# Patient Record
Sex: Male | Born: 1965
Health system: Southern US, Community
[De-identification: ages and names within clinical notes are randomized; demographics above are authoritative.]

## PROBLEM LIST (undated history)

## (undated) DIAGNOSIS — I10 Essential (primary) hypertension: Secondary | ICD-10-CM

## (undated) DIAGNOSIS — E119 Type 2 diabetes mellitus without complications: Secondary | ICD-10-CM

## (undated) HISTORY — DX: Essential (primary) hypertension: I10

## (undated) HISTORY — PX: COLONOSCOPY: SHX174

## (undated) HISTORY — PX: POLYPECTOMY: SHX149

## (undated) HISTORY — DX: Type 2 diabetes mellitus without complications: E11.9

## (undated) HISTORY — PX: SHOULDER SURGERY: SHX246

---

## 2007-02-25 ENCOUNTER — Emergency Department (HOSPITAL_COMMUNITY): Admission: EM | Admit: 2007-02-25 | Discharge: 2007-02-25 | Payer: Self-pay | Admitting: Emergency Medicine

## 2011-07-07 LAB — URINE MICROSCOPIC-ADD ON

## 2011-07-07 LAB — URINALYSIS, ROUTINE W REFLEX MICROSCOPIC
Bilirubin Urine: NEGATIVE
Glucose, UA: NEGATIVE
Hgb urine dipstick: NEGATIVE
Protein, ur: NEGATIVE
Urobilinogen, UA: 0.2

## 2011-07-07 LAB — URINE CULTURE: Colony Count: NO GROWTH

## 2016-06-10 ENCOUNTER — Ambulatory Visit (HOSPITAL_COMMUNITY)
Admission: EM | Admit: 2016-06-10 | Discharge: 2016-06-10 | Disposition: A | Discharge status: Home / Self Care | Payer: 59 | Attending: Family Medicine | Admitting: Family Medicine

## 2016-06-10 ENCOUNTER — Encounter (HOSPITAL_COMMUNITY): Payer: Self-pay | Admitting: *Deleted

## 2016-06-10 DIAGNOSIS — M25561 Pain in right knee: Secondary | ICD-10-CM | POA: Diagnosis not present

## 2016-06-10 DIAGNOSIS — B070 Plantar wart: Secondary | ICD-10-CM | POA: Diagnosis not present

## 2016-06-10 MED ORDER — PREDNISONE 20 MG PO TABS: ORAL_TABLET | ORAL | 0 refills | Status: DC

## 2016-06-10 NOTE — ED Provider Notes (Addendum)
[place]    CSN: [id number] Arrival date & time: 06/10/16  [id number]  First Provider Contact:  First MD Initiated Contact with Patient 06/10/16 1039        History   Chief Complaint Chief Complaint  Patient presents with  . Knee Pain    HPI Jose Kelley is a 50 y.o. male.   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  He developed a wart on his left great toe a month ago, and subsequently right knee swelling and discomfort when trying to fully flex his right knee.  The toe has become quite sensitive to the touch  He has no hx of trauma to the knee and no other joints hurt.      History reviewed. No pertinent past medical history.  There are no active problems to display for this patient.   History reviewed. No pertinent surgical history.     Home Medications    Prior to Admission medications   Medication Sig Start Date End Date Taking? Authorizing Provider  predniSONE (DELTASONE) 20 MG tablet Two daily with food 06/10/16   Robyn Haber, MD    Family History History reviewed. No pertinent family history.  Social History Social History  Substance Use Topics  . Smoking status: Current Every Day Smoker  . Smokeless tobacco: Current User  . Alcohol use No     Allergies   Review of patient's allergies indicates not on file.   Review of Systems Review of Systems  Constitutional: Negative.   HENT: Negative.   Eyes: Negative.   Respiratory: Negative.   Cardiovascular: Negative.   Gastrointestinal: Negative.   Musculoskeletal: Positive for joint swelling.     Physical Exam Triage Vital Signs ED Triage Vitals [06/10/16 1014]  Enc Vitals Group     BP 166/100     Pulse Rate 74     Resp 16     Temp 98.1 F (36.7 C)     Temp Source Oral     SpO2 98 %     Weight      Height      Head Circumference      Peak Flow      Pain Score      Pain Loc      Pain Edu?      Excl. in [place]?     No data found.   Updated Vital Signs BP 166/100 (BP Location: Left Arm)   Pulse 74   Temp 98.1 F (36.7 C) (Oral)   Resp 16   SpO2 98%       Physical Exam  Constitutional: He appears well-developed and well-nourished.  HENT:  Right Ear: External ear normal.  Left Ear: External ear normal.  Nose: Nose normal.  Mouth/Throat: Oropharynx is clear and moist.  Eyes: Conjunctivae and EOM are normal. Pupils are equal, round, and reactive to light.  Neck: Normal range of motion. Neck supple.  Cardiovascular: Normal rate.   Pulmonary/Chest: Effort normal.  Musculoskeletal: Normal range of motion. He exhibits no edema, tenderness or deformity.  Mild popliteal fullness behind the right knee, unable to completely flex the right knee.  Neurological: He is alert.  Skin:  Small wart on the mid plantar surface of the left great toe which is quite tender. This was debrided with a #15 Bard-Parker blade relieving most of the patient's pain when standing.  Nursing note and vitals reviewed.    UC Treatments / Results  Labs (all labs ordered are listed, but only abnormal results are displayed) Labs Reviewed - No data to display  EKG  EKG Interpretation None       Radiology No results found.  Procedures Procedures (including critical care time)  Medications Ordered in UC Medications - No data to display   Initial Impression / Assessment and Plan / UC Course  I have reviewed the triage vital signs and the nursing notes.  Pertinent labs & imaging results that were available during my care of the patient were reviewed by me and considered in my medical decision making (see chart for details).  Clinical Course      Final Clinical Impressions(s) / UC Diagnoses   Final diagnoses:  Knee pain, acute, right  Plantar wart of left foot    New Prescriptions New Prescriptions   PREDNISONE (DELTASONE) 20 MG TABLET    Two daily with food     Robyn Haber, MD 06/10/16  1058    Robyn Haber, MD 06/10/16 1104

## 2016-06-10 NOTE — Discharge Instructions (Signed)
Two different strategies for curing warts:   1. Take cimetidine 300 mg twice a day for a month 2. Apply adhesive tape over the wart for a month  In the meantime, whittle the thickened skin when the toe gets painful

## 2016-06-10 NOTE — ED Triage Notes (Signed)
Pt  Reports    Pain  r  Knee       X  1   Week     -     denys  Any  specefic  Injury  Pt  Also  Reports    Soreness  l  Big  Toe         As  Well

## 2016-11-19 ENCOUNTER — Ambulatory Visit (INDEPENDENT_AMBULATORY_CARE_PROVIDER_SITE_OTHER): Payer: BLUE CROSS/BLUE SHIELD | Admitting: Family Medicine

## 2016-11-19 ENCOUNTER — Telehealth: Payer: Self-pay

## 2016-11-19 ENCOUNTER — Telehealth: Payer: Self-pay | Admitting: Family Medicine

## 2016-11-19 VITALS — BP 172/90 | HR 85 | Temp 98.2°F | Resp 18 | Ht 67.5 in | Wt 173.4 lb

## 2016-11-19 DIAGNOSIS — J209 Acute bronchitis, unspecified: Secondary | ICD-10-CM | POA: Diagnosis not present

## 2016-11-19 DIAGNOSIS — Z720 Tobacco use: Secondary | ICD-10-CM | POA: Diagnosis not present

## 2016-11-19 DIAGNOSIS — I10 Essential (primary) hypertension: Secondary | ICD-10-CM

## 2016-11-19 MED ORDER — BENZONATATE 200 MG PO CAPS: 200.0000 mg | ORAL_CAPSULE | Freq: Three times a day (TID) | ORAL | 0 refills | Status: DC | PRN

## 2016-11-19 MED ORDER — CODEINE POLT-CHLORPHEN POLT ER 14.7-2.8 MG/5ML PO SUER: 10.0000 mL | Freq: Every day | ORAL | 0 refills | Status: DC

## 2016-11-19 MED ORDER — HYDROCHLOROTHIAZIDE 25 MG PO TABS: 25.0000 mg | ORAL_TABLET | Freq: Every day | ORAL | 1 refills | Status: DC

## 2016-11-19 NOTE — Telephone Encounter (Signed)
Pt called stating he needs a Prior Auth. For Codeine Polt-Chlorphen Polt ER (TUZISTRA XR) 14.7-2.8 MG/5ML SUER [id number]   Please advise: [phone]

## 2016-11-19 NOTE — Telephone Encounter (Signed)
tuzistra xr needs PA [id number] covermymeds

## 2016-11-19 NOTE — Patient Instructions (Addendum)
IF you received an x-ray today, you will receive an invoice from [facility]. Please contact [facility] at [phone] with questions or concerns regarding your invoice.   IF you received labwork today, you will receive an invoice from [place]. Please contact [facility] at [phone] with questions or concerns regarding your invoice.   Our billing staff will not be able to assist you with questions regarding bills from these companies.  You will be contacted with the lab results as soon as they are available. The fastest way to get your results is to activate your My Chart account. Instructions are located on the last page of this paperwork. If you have not heard from [place] regarding the results in 2 weeks, please contact this office.      Hypertension Hypertension, commonly called high blood pressure, is when the force of blood pumping through the arteries is too strong. The arteries are the blood vessels that carry blood from the heart throughout the body. Hypertension forces the heart to work harder to pump blood and may cause arteries to become narrow or stiff. Having untreated or uncontrolled hypertension can cause heart attacks, strokes, kidney disease, and other problems. A blood pressure reading consists of a higher number over a lower number. Ideally, your blood pressure should be below 120/80. The first ("top") number is called the systolic pressure. It is a measure of the pressure in your arteries as your heart beats. The second ("bottom") number is called the diastolic pressure. It is a measure of the pressure in your arteries as the heart relaxes. What are the causes? The cause of this condition is not known. What increases the risk? Some risk factors for high blood pressure are under your control. Others are not. Factors you can change   Smoking.  Having type 2 diabetes mellitus, high cholesterol, or both.  Not getting enough exercise or physical  activity.  Being overweight.  Having too much fat, sugar, calories, or salt (sodium) in your diet.  Drinking too much alcohol. Factors that are difficult or impossible to change   Having chronic kidney disease.  Having a family history of high blood pressure.  Age. Risk increases with age.  Race. You may be at higher risk if you are African-American.  Gender. Men are at higher risk than women before age 18. After age 96, women are at higher risk than men.  Having obstructive sleep apnea.  Stress. What are the signs or symptoms? Extremely high blood pressure (hypertensive crisis) may cause:  Headache.  Anxiety.  Shortness of breath.  Nosebleed.  Nausea and vomiting.  Severe chest pain.  Jerky movements you cannot control (seizures). How is this diagnosed? This condition is diagnosed by measuring your blood pressure while you are seated, with your arm resting on a surface. The cuff of the blood pressure monitor will be placed directly against the skin of your upper arm at the level of your heart. It should be measured at least twice using the same arm. Certain conditions can cause a difference in blood pressure between your right and left arms. Certain factors can cause blood pressure readings to be lower or higher than normal (elevated) for a short period of time:  When your blood pressure is higher when you are in a health care provider's office than when you are at home, this is called white coat hypertension. Most people with this condition do not need medicines.  When your blood pressure is higher at home than  when you are in a health care provider's office, this is called masked hypertension. Most people with this condition may need medicines to control blood pressure. If you have a high blood pressure reading during one visit or you have normal blood pressure with other risk factors:  You may be asked to return on a different day to have your blood pressure checked  again.  You may be asked to monitor your blood pressure at home for 1 week or longer. If you are diagnosed with hypertension, you may have other blood or imaging tests to help your health care provider understand your overall risk for other conditions. How is this treated? This condition is treated by making healthy lifestyle changes, such as eating healthy foods, exercising more, and reducing your alcohol intake. Your health care provider may prescribe medicine if lifestyle changes are not enough to get your blood pressure under control, and if:  Your systolic blood pressure is above 130.  Your diastolic blood pressure is above 80. Your personal target blood pressure may vary depending on your medical conditions, your age, and other factors. Follow these instructions at home: Eating and drinking   Eat a diet that is high in fiber and potassium, and low in sodium, added sugar, and fat. An example eating plan is called the DASH (Dietary Approaches to Stop Hypertension) diet. To eat this way:  Eat plenty of fresh fruits and vegetables. Try to fill half of your plate at each meal with fruits and vegetables.  Eat whole grains, such as whole wheat pasta, brown rice, or whole grain bread. Fill about one quarter of your plate with whole grains.  Eat or drink low-fat dairy products, such as skim milk or low-fat yogurt.  Avoid fatty cuts of meat, processed or cured meats, and poultry with skin. Fill about one quarter of your plate with lean proteins, such as fish, chicken without skin, beans, eggs, and tofu.  Avoid premade and processed foods. These tend to be higher in sodium, added sugar, and fat.  Reduce your daily sodium intake. Most people with hypertension should eat less than 1,500 mg of sodium a day.  Limit alcohol intake to no more than 1 drink a day for nonpregnant women and 2 drinks a day for men. One drink equals 12 oz of beer, 5 oz of wine, or 1 oz of hard liquor. Lifestyle   Work  with your health care provider to maintain a healthy body weight or to lose weight. Ask what an ideal weight is for you.  Get at least 30 minutes of exercise that causes your heart to beat faster (aerobic exercise) most days of the week. Activities may include walking, swimming, or biking.  Include exercise to strengthen your muscles (resistance exercise), such as pilates or lifting weights, as part of your weekly exercise routine. Try to do these types of exercises for 30 minutes at least 3 days a week.  Do not use any products that contain nicotine or tobacco, such as cigarettes and e-cigarettes. If you need help quitting, ask your health care provider.  Monitor your blood pressure at home as told by your health care provider.  Keep all follow-up visits as told by your health care provider. This is important. Medicines   Take over-the-counter and prescription medicines only as told by your health care provider. Follow directions carefully. Blood pressure medicines must be taken as prescribed.  Do not skip doses of blood pressure medicine. Doing this puts you at risk for  problems and can make the medicine less effective.  Ask your health care provider about side effects or reactions to medicines that you should watch for. Contact a health care provider if:  You think you are having a reaction to a medicine you are taking.  You have headaches that keep coming back (recurring).  You feel dizzy.  You have swelling in your ankles.  You have trouble with your vision. Get help right away if:  You develop a severe headache or confusion.  You have unusual weakness or numbness.  You feel faint.  You have severe pain in your chest or abdomen.  You vomit repeatedly.  You have trouble breathing. Summary  Hypertension is when the force of blood pumping through your arteries is too strong. If this condition is not controlled, it may put you at risk for serious complications.  Your  personal target blood pressure may vary depending on your medical conditions, your age, and other factors. For most people, a normal blood pressure is less than 120/80.  Hypertension is treated with lifestyle changes, medicines, or a combination of both. Lifestyle changes include weight loss, eating a healthy, low-sodium diet, exercising more, and limiting alcohol. This information is not intended to replace advice given to you by your health care provider. Make sure you discuss any questions you have with your health care provider. Document Released: 09/05/2005 Document Revised: 08/03/2016 Document Reviewed: 08/03/2016 Elsevier Interactive Patient Education  2017 [place] DASH stands for "Dietary Approaches to Stop Hypertension." The DASH eating plan is a healthy eating plan that has been shown to reduce high blood pressure (hypertension). It may also reduce your risk for type 2 diabetes, heart disease, and stroke. The DASH eating plan may also help with weight loss. What are tips for following this plan? General guidelines   Avoid eating more than 2,300 mg (milligrams) of salt (sodium) a day. If you have hypertension, you may need to reduce your sodium intake to 1,500 mg a day.  Limit alcohol intake to no more than 1 drink a day for nonpregnant women and 2 drinks a day for men. One drink equals 12 oz of beer, 5 oz of wine, or 1 oz of hard liquor.  Work with your health care provider to maintain a healthy body weight or to lose weight. Ask what an ideal weight is for you.  Get at least 30 minutes of exercise that causes your heart to beat faster (aerobic exercise) most days of the week. Activities may include walking, swimming, or biking.  Work with your health care provider or diet and nutrition specialist (dietitian) to adjust your eating plan to your individual calorie needs. Reading food labels   Check food labels for the amount of sodium per serving. Choose foods  with less than 5 percent of the Daily Value of sodium. Generally, foods with less than 300 mg of sodium per serving fit into this eating plan.  To find whole grains, look for the word "whole" as the first word in the ingredient list. Shopping   Buy products labeled as "low-sodium" or "no salt added."  Buy fresh foods. Avoid canned foods and premade or frozen meals. Cooking   Avoid adding salt when cooking. Use salt-free seasonings or herbs instead of table salt or sea salt. Check with your health care provider or pharmacist before using salt substitutes.  Do not fry foods. Cook foods using healthy methods such as baking, boiling, grilling, and broiling instead.  Cook with heart-healthy oils, such as olive, canola, soybean, or sunflower oil. Meal planning    Eat a balanced diet that includes:  5 or more servings of fruits and vegetables each day. At each meal, try to fill half of your plate with fruits and vegetables.  Up to 6-8 servings of whole grains each day.  Less than 6 oz of lean meat, poultry, or fish each day. A 3-oz serving of meat is about the same size as a deck of cards. One egg equals 1 oz.  2 servings of low-fat dairy each day.  A serving of nuts, seeds, or beans 5 times each week.  Heart-healthy fats. Healthy fats called Omega-3 fatty acids are found in foods such as flaxseeds and coldwater fish, like sardines, salmon, and mackerel.  Limit how much you eat of the following:  Canned or prepackaged foods.  Food that is high in trans fat, such as fried foods.  Food that is high in saturated fat, such as fatty meat.  Sweets, desserts, sugary drinks, and other foods with added sugar.  Full-fat dairy products.  Do not salt foods before eating.  Try to eat at least 2 vegetarian meals each week.  Eat more home-cooked food and less restaurant, buffet, and fast food.  When eating at a restaurant, ask that your food be prepared with less salt or no salt, if  possible. What foods are recommended? The items listed may not be a complete list. Talk with your dietitian about what dietary choices are best for you. Grains  Whole-grain or whole-wheat bread. Whole-grain or whole-wheat pasta. Brown rice. [place]. Bulgur. Whole-grain and low-sodium cereals. Pita bread. Low-fat, low-sodium crackers. Whole-wheat flour tortillas. Vegetables  Fresh or frozen vegetables (raw, steamed, roasted, or grilled). Low-sodium or reduced-sodium tomato and vegetable juice. Low-sodium or reduced-sodium tomato sauce and tomato paste. Low-sodium or reduced-sodium canned vegetables. Fruits  All fresh, dried, or frozen fruit. Canned fruit in natural juice (without added sugar). Meat and other protein foods  Skinless chicken or [place]. Ground chicken or [place]. Pork with fat trimmed off. Fish and seafood. Egg whites. Dried beans, peas, or lentils. Unsalted nuts, nut butters, and seeds. Unsalted canned beans. Lean cuts of beef with fat trimmed off. Low-sodium, lean deli meat. Dairy  Low-fat (1%) or fat-free (skim) milk. Fat-free, low-fat, or reduced-fat cheeses. Nonfat, low-sodium ricotta or cottage cheese. Low-fat or nonfat yogurt. Low-fat, low-sodium cheese. Fats and oils  Soft margarine without trans fats. Vegetable oil. Low-fat, reduced-fat, or light mayonnaise and salad dressings (reduced-sodium). Canola, safflower, olive, soybean, and sunflower oils. Avocado. Seasoning and other foods  Herbs. Spices. Seasoning mixes without salt. Unsalted popcorn and pretzels. Fat-free sweets. What foods are not recommended? The items listed may not be a complete list. Talk with your dietitian about what dietary choices are best for you. Grains  Baked goods made with fat, such as croissants, muffins, or some breads. Dry pasta or rice meal packs. Vegetables  Creamed or fried vegetables. Vegetables in a cheese sauce. Regular canned vegetables (not low-sodium or reduced-sodium). Regular  canned tomato sauce and paste (not low-sodium or reduced-sodium). Regular tomato and vegetable juice (not low-sodium or reduced-sodium). Angie Fava. Olives. Fruits  Canned fruit in a light or heavy syrup. Fried fruit. Fruit in cream or butter sauce. Meat and other protein foods  Fatty cuts of meat. Ribs. Fried meat. Berniece Salines. Sausage. Bologna and other processed lunch meats. Salami. Fatback. Hotdogs. Bratwurst. Salted nuts and seeds. Canned beans with added salt. Canned or smoked  fish. Whole eggs or egg yolks. Chicken or [place] with skin. Dairy  Whole or 2% milk, cream, and half-and-half. Whole or full-fat cream cheese. Whole-fat or sweetened yogurt. Full-fat cheese. Nondairy creamers. Whipped toppings. Processed cheese and cheese spreads. Fats and oils  Butter. Stick margarine. Lard. Shortening. Ghee. Bacon fat. Tropical oils, such as coconut, palm kernel, or palm oil. Seasoning and other foods  Salted popcorn and pretzels. Onion salt, garlic salt, seasoned salt, table salt, and sea salt. Worcestershire sauce. Tartar sauce. Barbecue sauce. Teriyaki sauce. Soy sauce, including reduced-sodium. Steak sauce. Canned and packaged gravies. Fish sauce. Oyster sauce. Cocktail sauce. Horseradish that you find on the shelf. Ketchup. Mustard. Meat flavorings and tenderizers. Bouillon cubes. Hot sauce and Tabasco sauce. Premade or packaged marinades. Premade or packaged taco seasonings. Relishes. Regular salad dressings. Where to find more information:  National Heart, Lung, and [place]: https://wilson-eaton.com/  American Heart Association: www.heart.org Summary  The DASH eating plan is a healthy eating plan that has been shown to reduce high blood pressure (hypertension). It may also reduce your risk for type 2 diabetes, heart disease, and stroke.  With the DASH eating plan, you should limit salt (sodium) intake to 2,300 mg a day. If you have hypertension, you may need to reduce your sodium intake to 1,500 mg a  day.  When on the DASH eating plan, aim to eat more fresh fruits and vegetables, whole grains, lean proteins, low-fat dairy, and heart-healthy fats.  Work with your health care provider or diet and nutrition specialist (dietitian) to adjust your eating plan to your individual calorie needs. This information is not intended to replace advice given to you by your health care provider. Make sure you discuss any questions you have with your health care provider. Document Released: 08/25/2011 Document Revised: 08/29/2016 Document Reviewed: 08/29/2016 Elsevier Interactive Patient Education  2017 [employer].

## 2016-11-19 NOTE — Telephone Encounter (Signed)
Approved today

## 2016-11-19 NOTE — Progress Notes (Signed)
By signing my name below, I, Mesha Guinyard, attest that this documentation has been prepared under the direction and in the presence of Delman Cheadle, MD.  Electronically Signed: Verlee Monte, Medical Scribe. 11/19/16. 11:59 AM.  Subjective:    Patient ID: Jose Kelley, male    DOB: 1965-10-12, 51 y.o.   MRN: [id number]  HPI Chief Complaint  Patient presents with  . Cough    started with a cold about 10 days ago, C/O prod cough, chest congestion, & chest pain when coughing    HPI Comments: Jose Kelley is a 51 y.o. male who presents to the [facility] complaining of painful productive cough with yellow to clear sputum (mainly clear) onset 10 days ago. He states he's over his cold, but reports associated sxs of chest congestion, rhinorrhea with clear sputum, little sinus pressure, little sinus pain, and some SOB. Took mucinex, and nyquil for little relief of his sxs. Pt did not drink alcohol last night. Pt smokes 0.5 ppd, or 4 packs a week. He has unsuccessfully tried to quit cold [place] in the past. Denies PMHx of asthma. Denies sleep loss, ear pain, postnasal drip, fever, and sneezing.  Blood Pressure: Pt states it's been a while since he's had his bp checked. Denies HA, changes in vision, and palpitations. BP Readings from Last 3 Encounters:  11/19/16 (!) 170/100  06/10/16 166/100   There are no active problems to display for this patient.  No past medical history on file. No past surgical history on file. No Known Allergies Prior to Admission medications   Not on File   Social History   Social History  . Marital status: Married    Spouse name: N/A  . Number of children: N/A  . Years of education: N/A   Occupational History  . Not on file.   Social History Main Topics  . Smoking status: Current Every Day Smoker    Packs/day: 0.50    Years: 10.00  . Smokeless tobacco: Former Systems developer  . Alcohol use No  . Drug use: No  . Sexual activity: Not on file    Other Topics Concern  . Not on file   Social History Narrative  . No narrative on file   Review of Systems  Constitutional: Negative for fever.  HENT: Positive for congestion, rhinorrhea, sinus pain and sinus pressure. Negative for ear pain, postnasal drip and sneezing.   Eyes: Negative for visual disturbance.  Respiratory: Positive for cough and shortness of breath.   Cardiovascular: Positive for chest pain (with coughing). Negative for palpitations.  Neurological: Negative for headaches.  Psychiatric/Behavioral: Negative for sleep disturbance.   Objective:  Physical Exam  Constitutional: He appears well-developed and well-nourished. No distress.  HENT:  Head: Normocephalic and atraumatic.  Right Ear: Tympanic membrane is erythematous.  Left Ear: Tympanic membrane is erythematous.  Nose: Rhinorrhea present.  Mouth/Throat: Oropharynx is clear and moist. No oropharyngeal exudate.  Nares with erythema and rhinitis  Eyes: Conjunctivae are normal.  Neck: Neck supple. No thyroid mass and no thyromegaly present.  Cardiovascular: Normal rate, regular rhythm, S1 normal, S2 normal and normal heart sounds.  Exam reveals no gallop and no friction rub.   No murmur heard. Pulmonary/Chest: Effort normal and breath sounds normal. He has no decreased breath sounds. He has no wheezes. He has no rhonchi. He has no rales.  Lymphadenopathy:    He has cervical adenopathy (anterior).  Neurological: He is alert.  Skin: Skin is warm and dry.  Psychiatric: He has a normal mood and affect. His behavior is normal.  Nursing note and vitals reviewed.  BP (!) 170/100   Pulse 85   Temp 98.2 F (36.8 C) (Oral)   Resp 18   Ht 5' 7.5" (1.715 m)   Wt 173 lb 6 oz (78.6 kg)   SpO2 97%   BMI 26.75 kg/m    Manuel BP reading while sitting, right arm: 172/90 Assessment & Plan:   1. Acute bronchitis, unspecified organism - suspect that pt is over the acute illness of the bronchitis and is now left with a  post-viral cough worsened by chronic tobacco use. Treat symptomatically with cough suppression, call for zpack if sxs worsening with increased cough with increased or change in sputum. If any ShoB, wheezing, CP -> recheck in office immed.  2. Tobacco abuse - encouraged cessation, has cut down to 1/2 ppd, only tried cold [place] before so discuss other options at next OV when feeling better - pt agrees.  3. Essential hypertension - BP WAY to high, likely elev some with acute illness but to high to just attribute to illness alone, highly suspect underlying undiagnosed HTN so start hctz and RTC for recheck in 3 wks. Remind to stay away from all cough and cold medications containing decongestant, especially phenylephrine and pseudoephedrine (will be listed under the active ingredient list).  To make it easier, CoricidinHBP is a product tailored towards people with hypertension.      Meds ordered this encounter  Medications  . benzonatate (TESSALON) 200 MG capsule    Sig: Take 1 capsule (200 mg total) by mouth 3 (three) times daily as needed for cough.    Dispense:  45 capsule    Refill:  0  . Codeine Polt-Chlorphen Polt ER (TUZISTRA XR) 14.7-2.8 MG/5ML SUER    Sig: Take 10 mLs by mouth at bedtime.    Dispense:  140 mL    Refill:  0  . hydrochlorothiazide (HYDRODIURIL) 25 MG tablet    Sig: Take 1 tablet (25 mg total) by mouth daily.    Dispense:  30 tablet    Refill:  1    I personally performed the services described in this documentation, which was scribed in my presence. The recorded information has been reviewed and considered, and addended by me as needed.   Delman Cheadle, M.D.  Primary Care at [facility] [address] [place], [place] [address] [phone] phone [phone] fax  11/22/16 10:44 AM

## 2016-11-22 MED ORDER — PROMETHAZINE-CODEINE 6.25-10 MG/5ML PO SYRP: 5.0000 mL | ORAL_SOLUTION | ORAL | 0 refills | Status: DC | PRN

## 2016-11-22 NOTE — Telephone Encounter (Signed)
Please call in the promethazine-codeine cough syrup I ordered on the med list instead of the [place] and let pt know.  Thanks.

## 2016-11-22 NOTE — Telephone Encounter (Signed)
Called [facility]

## 2016-11-22 NOTE — Telephone Encounter (Signed)
DrugTuzistra XR 14.7-2.8MG /5ML er suspension  [employer] [place] initiate the prior authorization process, please use this form to document the information needed and call [employer] at [phone]  Original Claim [place] Prescriber. Preferred products are: [id number] Josie Saunders [id number] - [place] [employer]'s Preferred Products  HYDROCODONE-CHLORPHENI [id number]

## 2016-12-17 ENCOUNTER — Ambulatory Visit (INDEPENDENT_AMBULATORY_CARE_PROVIDER_SITE_OTHER): Payer: BLUE CROSS/BLUE SHIELD | Admitting: Family Medicine

## 2016-12-17 VITALS — BP 154/89 | HR 76 | Temp 98.0°F | Resp 18 | Ht 67.0 in | Wt 170.2 lb

## 2016-12-17 DIAGNOSIS — Z5181 Encounter for therapeutic drug level monitoring: Secondary | ICD-10-CM | POA: Diagnosis not present

## 2016-12-17 DIAGNOSIS — R7309 Other abnormal glucose: Secondary | ICD-10-CM

## 2016-12-17 DIAGNOSIS — F17200 Nicotine dependence, unspecified, uncomplicated: Secondary | ICD-10-CM | POA: Diagnosis not present

## 2016-12-17 DIAGNOSIS — I10 Essential (primary) hypertension: Secondary | ICD-10-CM

## 2016-12-17 DIAGNOSIS — J209 Acute bronchitis, unspecified: Secondary | ICD-10-CM | POA: Diagnosis not present

## 2016-12-17 DIAGNOSIS — E785 Hyperlipidemia, unspecified: Secondary | ICD-10-CM

## 2016-12-17 MED ORDER — CETIRIZINE HCL 10 MG PO TABS: 10.0000 mg | ORAL_TABLET | Freq: Every day | ORAL | 1 refills | Status: DC

## 2016-12-17 MED ORDER — LISINOPRIL-HYDROCHLOROTHIAZIDE 20-25 MG PO TABS: 1.0000 | ORAL_TABLET | Freq: Every day | ORAL | 1 refills | Status: DC

## 2016-12-17 NOTE — Patient Instructions (Addendum)
IF you received an x-ray today, you will receive an invoice from [facility]. Please contact [facility] at [phone] with questions or concerns regarding your invoice.   IF you received labwork today, you will receive an invoice from [facility]. Please contact [facility] at [phone] with questions or concerns regarding your invoice.   Our billing staff will not be able to assist you with questions regarding bills from these companies.  You will be contacted with the lab results as soon as they are available. The fastest way to get your results is to activate your My Chart account. Instructions are located on the last page of this paperwork. If you have not heard from [place] regarding the results in 2 weeks, please contact this office.     Managing Your Hypertension Hypertension is commonly called high blood pressure. This is when the force of your blood pressing against the walls of your arteries is too strong. Arteries are blood vessels that carry blood from your heart throughout your body. Hypertension forces the heart to work harder to pump blood, and may cause the arteries to become narrow or stiff. Having untreated or uncontrolled hypertension can cause heart attack, stroke, kidney disease, and other problems. What are blood pressure readings? A blood pressure reading consists of a higher number over a lower number. Ideally, your blood pressure should be below 120/80. The first ("top") number is called the systolic pressure. It is a measure of the pressure in your arteries as your heart beats. The second ("bottom") number is called the diastolic pressure. It is a measure of the pressure in your arteries as the heart relaxes. What does my blood pressure reading mean? Blood pressure is classified into four stages. Based on your blood pressure reading, your health care provider may use the following stages to determine what type of treatment you need, if any. Systolic  pressure and diastolic pressure are measured in a unit called mm Hg. Normal   Systolic pressure: below 606.  Diastolic pressure: below 80. Elevated   Systolic pressure: 301-601.  Diastolic pressure: below 80. Hypertension stage 1     Diastolic pressure: 09-32. Hypertension stage 2   Systolic pressure: 355 or above.  Diastolic pressure: 90 or above. What health risks are associated with hypertension? Managing your hypertension is an important responsibility. Uncontrolled hypertension can lead to:  A heart attack.  A stroke.  A weakened blood vessel (aneurysm).  Heart failure.  Kidney damage.  Eye damage.  Metabolic syndrome.  Memory and concentration problems. What changes can I make to manage my hypertension? Eating and drinking   Eat a diet that is high in fiber and potassium, and low in salt (sodium), added sugar, and fat. An example eating plan is called the DASH (Dietary Approaches to Stop Hypertension) diet. To eat this way:  Eat plenty of fresh fruits and vegetables. Try to fill half of your plate at each meal with fruits and vegetables.  Eat whole grains, such as whole wheat pasta, brown rice, or whole grain bread. Fill about one quarter of your plate with whole grains.  Eat low-fat diary products.  Avoid fatty cuts of meat, processed or cured meats, and poultry with skin. Fill about one quarter of your plate with lean proteins such as fish, chicken without skin, beans, eggs, and tofu.  Avoid premade and processed foods. These tend to be higher in sodium, added sugar, and fat.     Lifestyle   Work with your health care  provider to maintain a healthy body weight, or to lose weight. Ask what an ideal weight is for you.  Get at least 30 minutes of exercise that causes your heart to beat faster (aerobic exercise) most days of the week. Activities may include walking, swimming, or biking.       Monitoring   Monitor your blood pressure at home as  told by your health care provider. Your personal target blood pressure may vary depending on your medical conditions, your age, and other factors.  Have your blood pressure checked regularly, as often as told by your health care provider. Working with your health care provider   Review all the medicines you take with your health care provider because there may be side effects or interactions.  Talk with your health care provider about your diet, exercise habits, and other lifestyle factors that may be contributing to hypertension.  Visit your health care provider regularly. Your health care provider can help you create and adjust your plan for managing hypertension. Will I need medicine to control my blood pressure? Your health care provider may prescribe medicine if lifestyle changes are not enough to get your blood pressure under control, and if:  Your systolic blood pressure is 130 or higher.  Your diastolic blood pressure is 80 or higher. Take medicines only as told by your health care provider. Follow the directions carefully. Blood pressure medicines must be taken as prescribed. The medicine does not work as well when you skip doses. Skipping doses also puts you at risk for problems. Contact a health care provider if:  You think you are having a reaction to medicines you have taken.  You have repeated (recurrent) headaches.  You feel dizzy.  You have swelling in your ankles.  You have trouble with your vision. Get help right away if:  You develop a severe headache or confusion.  You have unusual weakness or numbness, or you feel faint.  You have severe pain in your chest or abdomen.  You vomit repeatedly.  You have trouble breathing. Summary  Hypertension is when the force of blood pumping through your arteries is too strong. If this condition is not controlled, it may put you at risk for serious complications.  Your personal target blood pressure may vary depending  on your medical conditions, your age, and other factors. For most people, a normal blood pressure is less than 120/80.  Hypertension is managed by lifestyle changes, medicines, or both. Lifestyle changes include weight loss, eating a healthy, low-sodium diet, exercising more, and limiting alcohol. This information is not intended to replace advice given to you by your health care provider. Make sure you discuss any questions you have with your health care provider. Document Released: 05/30/2012 Document Revised: 08/03/2016 Document Reviewed: 08/03/2016 Elsevier Interactive Patient Education  2017 [employer].

## 2016-12-17 NOTE — Progress Notes (Signed)
Subjective:  By signing my name below, I, Jose Kelley, attest that this documentation has been prepared under the direction and in the presence of Delman Cheadle, MD. Electronically Signed: Moises Kelley, [place]. 12/17/2016 , 12:03 PM .  Patient was seen in Room 3 .   Patient ID: Jose Kelley, male    DOB: 11-10-1965, 51 y.o.   MRN: [id number] Chief Complaint  Patient presents with  . Follow-up    f/u from bronchitis-sx's better, still has a slight cough  . Medication Refill    HCTZ & cough meds (if possible)   HPI Jose Kelley is a 51 y.o. male who presents to Primary Care at [facility] for follow up from bronchitis. Patient reports he's been improving, but still has a slight cough. He denies coughing up any phlegm. He denies seasonal allergies.   He also requested BP medication refill. He took HCTZ this morning. He's been urinating more frequently. He denies muscle cramps or muscle weakness. He hasn't been checking his BP outside of the office.   No past medical history on file. Prior to Admission medications   Medication Sig Start Date End Date Taking? Authorizing Provider  hydrochlorothiazide (HYDRODIURIL) 25 MG tablet Take 1 tablet (25 mg total) by mouth daily. 11/19/16  Yes Shawnee Knapp, MD  benzonatate (TESSALON) 200 MG capsule Take 1 capsule (200 mg total) by mouth 3 (three) times daily as needed for cough. Patient not taking: Reported on 12/17/2016 11/19/16   Shawnee Knapp, MD  promethazine-codeine [facility] WITH CODEINE) 6.25-10 MG/5ML syrup Take 5-10 mLs by mouth every 4 (four) hours as needed for cough. Patient not taking: Reported on 12/17/2016 11/22/16   Shawnee Knapp, MD   No Known Allergies  Review of Systems  Constitutional: Negative for fatigue and unexpected weight change.  Eyes: Negative for visual disturbance.  Respiratory: Positive for cough. Negative for chest tightness and shortness of breath.   Cardiovascular: Negative for chest pain, palpitations and leg swelling.    Gastrointestinal: Negative for abdominal pain and Kelley in stool.  Allergic/Immunologic: Negative for environmental allergies.  Neurological: Negative for dizziness, light-headedness and headaches.       Objective:   Physical Exam  Constitutional: He is oriented to person, place, and time. He appears well-developed and well-nourished. No distress.  HENT:  Head: Normocephalic and atraumatic.  Right Ear: Tympanic membrane is injected and retracted.  Left Ear: Tympanic membrane is injected and retracted.  Nose: Rhinorrhea (with erythema) present.  Mouth/Throat: Posterior oropharyngeal erythema present.  Eyes: EOM are normal. Pupils are equal, round, and reactive to light.  Neck: Neck supple. No JVD present. Carotid bruit is not present.  Cardiovascular: Normal rate, regular rhythm, S1 normal, S2 normal and normal heart sounds.   No murmur heard. Pulmonary/Chest: Effort normal and breath sounds normal. No respiratory distress. He has no rales.  Musculoskeletal: Normal range of motion. He exhibits no edema.  Neurological: He is alert and oriented to person, place, and time.  Skin: Skin is warm and dry.  Psychiatric: He has a normal mood and affect. His behavior is normal.  Nursing note and vitals reviewed.   BP (!) 154/89   Pulse 76   Temp 98 F (36.7 C) (Oral)   Resp 18   Ht 5\' 7"  (1.702 m)   Wt 170 lb 4 oz (77.2 kg)   SpO2 98%   BMI 26.66 kg/m    EKG: normal sinus rhythm, left anterior fascicular block    Assessment & Plan:  1. Essential hypertension   2. Medication monitoring encounter   Uncontrolled on hctz 25 so add in lisinopril 20. Recheck in 1 mo  Orders Placed This Encounter  Procedures  . Comprehensive metabolic panel    Order Specific Question:   Has the patient fasted?    Answer:   Yes  . Lipid panel    Order Specific Question:   Has the patient fasted?    Answer:   Yes  . Care order/instruction:    Scheduling Instructions:     Complete orders, AVS  and go.  . EKG 12-Lead    Meds ordered this encounter  Medications  . cetirizine (ZYRTEC) 10 MG tablet    Sig: Take 1 tablet (10 mg total) by mouth at bedtime.    Dispense:  30 tablet    Refill:  1  . lisinopril-hydrochlorothiazide (PRINZIDE,ZESTORETIC) 20-25 MG tablet    Sig: Take 1 tablet by mouth daily.    Dispense:  30 tablet    Refill:  1    I personally performed the services described in this documentation, which was scribed in my presence. The recorded information has been reviewed and considered, and addended by me as needed.   Delman Cheadle, M.D.  Primary Care at [facility] [address] [place], [place] [address] [phone] phone [phone] fax  01/14/17 10:27 AM

## 2016-12-18 LAB — COMPREHENSIVE METABOLIC PANEL
ALBUMIN: 4.4 g/dL (ref 3.5–5.5)
ALT: 22 IU/L (ref 0–44)
AST: 20 IU/L (ref 0–40)
Albumin/Globulin Ratio: 1.8 (ref 1.2–2.2)
Alkaline Phosphatase: 74 IU/L (ref 39–117)
BILIRUBIN TOTAL: 0.4 mg/dL (ref 0.0–1.2)
BUN / CREAT RATIO: 12 (ref 9–20)
BUN: 11 mg/dL (ref 6–24)
CALCIUM: 9.5 mg/dL (ref 8.7–10.2)
CHLORIDE: 102 mmol/L (ref 96–106)
CO2: 23 mmol/L (ref 18–29)
CREATININE: 0.92 mg/dL (ref 0.76–1.27)
GFR calc non Af Amer: 95 mL/min/{1.73_m2} (ref >59)
GFR, EST AFRICAN AMERICAN: 110 mL/min/{1.73_m2} (ref >59)
GLUCOSE: 115 mg/dL — AB (ref 65–99)
Globulin, Total: 2.5 g/dL (ref 1.5–4.5)
Potassium: 4.2 mmol/L (ref 3.5–5.2)
Sodium: 140 mmol/L (ref 134–144)
TOTAL PROTEIN: 6.9 g/dL (ref 6.0–8.5)

## 2016-12-18 LAB — LIPID PANEL
Chol/HDL Ratio: 5.5 ratio — ABNORMAL HIGH (ref 0.0–5.0)
Cholesterol, Total: 236 mg/dL — ABNORMAL HIGH (ref 100–199)
HDL: 43 mg/dL (ref >39)
LDL CALC: 164 mg/dL — AB (ref 0–99)
Triglycerides: 147 mg/dL (ref 0–149)
VLDL CHOLESTEROL CAL: 29 mg/dL (ref 5–40)

## 2017-01-14 ENCOUNTER — Ambulatory Visit (INDEPENDENT_AMBULATORY_CARE_PROVIDER_SITE_OTHER): Payer: BLUE CROSS/BLUE SHIELD | Admitting: Family Medicine

## 2017-01-14 ENCOUNTER — Ambulatory Visit (INDEPENDENT_AMBULATORY_CARE_PROVIDER_SITE_OTHER): Payer: BLUE CROSS/BLUE SHIELD

## 2017-01-14 ENCOUNTER — Encounter: Payer: Self-pay | Admitting: Family Medicine

## 2017-01-14 VITALS — BP 135/88 | HR 101 | Temp 98.4°F | Resp 17 | Ht 67.0 in | Wt 170.0 lb

## 2017-01-14 DIAGNOSIS — R05 Cough: Secondary | ICD-10-CM | POA: Diagnosis not present

## 2017-01-14 DIAGNOSIS — E1165 Type 2 diabetes mellitus with hyperglycemia: Secondary | ICD-10-CM | POA: Diagnosis not present

## 2017-01-14 DIAGNOSIS — R053 Chronic cough: Secondary | ICD-10-CM

## 2017-01-14 DIAGNOSIS — F17200 Nicotine dependence, unspecified, uncomplicated: Secondary | ICD-10-CM

## 2017-01-14 DIAGNOSIS — I10 Essential (primary) hypertension: Secondary | ICD-10-CM | POA: Diagnosis not present

## 2017-01-14 DIAGNOSIS — R7309 Other abnormal glucose: Secondary | ICD-10-CM

## 2017-01-14 DIAGNOSIS — Z5181 Encounter for therapeutic drug level monitoring: Secondary | ICD-10-CM

## 2017-01-14 DIAGNOSIS — E785 Hyperlipidemia, unspecified: Secondary | ICD-10-CM | POA: Insufficient documentation

## 2017-01-14 LAB — POCT CBC
GRANULOCYTE PERCENT: 81.1 % — AB (ref 37–80)
HEMATOCRIT: 49.2 % (ref 43.5–53.7)
Hemoglobin: 16.5 g/dL (ref 14.1–18.1)
Lymph, poc: 1 (ref 0.6–3.4)
MCH, POC: 29.9 pg (ref 27–31.2)
MCHC: 33.6 g/dL (ref 31.8–35.4)
MCV: 88.8 fL (ref 80–97)
MID (CBC): 0.3 (ref 0–0.9)
MPV: 8.5 fL (ref 0–99.8)
POC GRANULOCYTE: 5.8 (ref 2–6.9)
POC LYMPH %: 14.7 % (ref 10–50)
POC MID %: 4.2 % (ref 0–12)
Platelet Count, POC: 183 10*3/uL (ref 142–424)
RBC: 5.54 M/uL (ref 4.69–6.13)
RDW, POC: 15 %
WBC: 7.1 10*3/uL (ref 4.6–10.2)

## 2017-01-14 LAB — POCT URINALYSIS DIP (MANUAL ENTRY)
Bilirubin, UA: NEGATIVE
Blood, UA: NEGATIVE
Glucose, UA: NEGATIVE mg/dL
Ketones, POC UA: NEGATIVE mg/dL
LEUKOCYTES UA: NEGATIVE
NITRITE UA: NEGATIVE
PH UA: 5.5 (ref 5.0–8.0)
PROTEIN UA: NEGATIVE mg/dL
Spec Grav, UA: 1.02 (ref 1.010–1.025)
Urobilinogen, UA: 0.2 E.U./dL

## 2017-01-14 LAB — POCT GLYCOSYLATED HEMOGLOBIN (HGB A1C): HEMOGLOBIN A1C: 7.3

## 2017-01-14 LAB — POCT SEDIMENTATION RATE: POCT SED RATE: 45 mm/hr — AB (ref 0–22)

## 2017-01-14 MED ORDER — ATORVASTATIN CALCIUM 40 MG PO TABS: 40.0000 mg | ORAL_TABLET | Freq: Every day | ORAL | 3 refills | Status: DC

## 2017-01-14 MED ORDER — METFORMIN HCL 500 MG PO TABS: 500.0000 mg | ORAL_TABLET | Freq: Two times a day (BID) | ORAL | 3 refills | Status: DC

## 2017-01-14 MED ORDER — FLUTICASONE PROPIONATE 50 MCG/ACT NA SUSP: 2.0000 | Freq: Every day | NASAL | 2 refills | Status: DC

## 2017-01-14 MED ORDER — ASPIRIN EC 81 MG PO TBEC: 81.0000 mg | DELAYED_RELEASE_TABLET | Freq: Every day | ORAL | Status: DC

## 2017-01-14 MED ORDER — BLOOD GLUCOSE MONITOR KIT: PACK | 0 refills | Status: AC

## 2017-01-14 MED ORDER — MUCINEX DM MAXIMUM STRENGTH 60-1200 MG PO TB12: 1.0000 | ORAL_TABLET | Freq: Every day | ORAL | 1 refills | Status: DC

## 2017-01-14 NOTE — Progress Notes (Signed)
Subjective:  By signing my name below, I, Moises Blood, attest that this documentation has been prepared under the direction and in the presence of Delman Cheadle, MD. Electronically Signed: Moises Blood, [place]. 01/14/2017 , 12:27 PM .  Patient was seen in Room 2 .   Patient ID: Jose Kelley, male    DOB: 28-Jan-1966, 51 y.o.   MRN: [id number] Chief Complaint  Patient presents with  . Follow-up    F/U on Blood Pressure   HPI Jose Kelley is a 51 y.o. male who presents to Primary Care at [facility] for follow up on blood pressure. He was last seen a month ago, with BP still uncontrolled at 150s/90s on HCTZ '25mg'$ , so added lisinopril '20mg'$ .   Patient states he hasn't been able to check his BP outside of office since his last visit. He does report improvement with his cough during the day, but he's still coughing when he first lays down in bed at night. He would drink water for relief. He denies any changes with the cough. He denies heart palpitations or swelling in his legs. He's been cutting back his smoking a lot, weaning to work on stopping. He's down to 3 cigarettes a day from a pack a day. He denies congestion, sinus congestion, sinus drainage, heart burn or indigestion, or wheezing.   He's been taking zyrtec as instructed for allergies. He denies using nasal spray. He wasn't able to have relief with tessalon perles. He had improvement with the cough syrup, however he's run out.   No past medical history on file. Prior to Admission medications   Medication Sig Start Date End Date Taking? Authorizing Provider  cetirizine (ZYRTEC) 10 MG tablet Take 1 tablet (10 mg total) by mouth at bedtime. 12/17/16  Yes Shawnee Knapp, MD  lisinopril-hydrochlorothiazide (PRINZIDE,ZESTORETIC) 20-25 MG tablet Take 1 tablet by mouth daily. 12/17/16  Yes Shawnee Knapp, MD   No Known Allergies  Review of Systems  Constitutional: Negative for fatigue and unexpected weight change.  HENT: Negative for congestion, sinus  pain and sinus pressure.   Eyes: Negative for visual disturbance.  Respiratory: Positive for cough. Negative for chest tightness, shortness of breath and wheezing.   Cardiovascular: Negative for chest pain, palpitations and leg swelling.  Gastrointestinal: Negative for abdominal pain and blood in stool.  Neurological: Negative for dizziness, light-headedness and headaches.       Objective:   Physical Exam  Constitutional: He is oriented to person, place, and time. He appears well-developed and well-nourished. No distress.  HENT:  Head: Normocephalic and atraumatic.  Right Ear: Tympanic membrane normal.  Left Ear: Tympanic membrane normal.  Nose: Rhinorrhea (significant allergic rhinitis) present.  Mouth/Throat: Oropharynx is clear and moist.  Eyes: EOM are normal. Pupils are equal, round, and reactive to light.  Neck: Neck supple.  Cardiovascular: Regular rhythm, S1 normal, S2 normal and normal heart sounds.  Tachycardia present.   Pulmonary/Chest: Effort normal. No respiratory distress.  Musculoskeletal: Normal range of motion.  Neurological: He is alert and oriented to person, place, and time.  Skin: Skin is warm and dry.  Psychiatric: He has a normal mood and affect. His behavior is normal.  Nursing note and vitals reviewed.   BP 135/88   Pulse (!) 101   Temp 98.4 F (36.9 C) (Oral)   Resp 17   Ht '5\' 7"'$  (1.702 m)   Wt 170 lb (77.1 kg)   SpO2 95%   BMI 26.63 kg/m   Results for orders placed  or performed in visit on 01/14/17  POCT glycosylated hemoglobin (Hb A1C)  Result Value Ref Range   Hemoglobin A1C 7.3   POCT urinalysis dipstick  Result Value Ref Range   Color, UA yellow yellow   Clarity, UA clear clear   Glucose, UA negative negative mg/dL   Bilirubin, UA negative negative   Ketones, POC UA negative negative mg/dL   Spec Grav, UA 8.682 5.749 - 1.025   Blood, UA negative negative   pH, UA 5.5 5.0 - 8.0   Protein Ur, POC negative negative mg/dL    Urobilinogen, UA 0.2 0.2 or 1.0 E.U./dL   Nitrite, UA Negative Negative   Leukocytes, UA Negative Negative   Dg Chest 2 View  Result Date: 01/14/2017 CLINICAL DATA:  Cough for 1 month. EXAM: CHEST  2 VIEW COMPARISON:  None. FINDINGS: The heart size and mediastinal contours are within normal limits. Both lungs are clear. The visualized skeletal structures are unremarkable. IMPRESSION: No active cardiopulmonary disease. Electronically Signed   By: Elige Ko   On: 01/14/2017 12:39       Assessment & Plan:   1. Essential hypertension - much improved control on lisinopril-hctz so continue. I am concerned that this could be causing his nighttime cough but he certainly has numerous other potential sources of cough  2. Medication monitoring encounter   3. Elevated glucose   4. Tobacco use disorder - has cut down from 1 ppd to 3 cigs/d.  5. Hyperlipidemia, unspecified hyperlipidemia type - start atorvastatin 80  6. Persistent cough for 3 weeks or longer - suspect due to pnd/allergic rhinitis but could be from LRD, tobacco use, and/or lisinopril. Try pretreating with a mucinex DM and flonase an hour before bed. He is out of the cough syrups which worked but failed benzonatate.  7. Type 2 diabetes mellitus with hyperglycemia, without long-term current use of insulin (HCC) - start asa 81mg  qd. Start metformin 500mg  bid but at least take qam (pt unsure he will be able to comply with bid med.) Referred to optho and DM ed. Recheck in 3 mos.   Pt discouraged about how many meds he has to take - before I saw him 2 mos ago he was on no chronic meds and he has now been diagnosed with HTN, HLD, and DMII and is on 3 rx meds.  Gave pt numerous pt handouts from [facility]. Reviewed when to check cbgs and interpret the information.   Orders Placed This Encounter  Procedures  . DG Chest 2 View    Standing Status:   Future    Number of Occurrences:   1    Standing Expiration Date:   01/14/2018    Order Specific  Question:   Reason for Exam (SYMPTOM  OR DIAGNOSIS REQUIRED)    Answer:   cough x 10 wks, smoker    Order Specific Question:   Preferred imaging location?    Answer:   External  . Basic metabolic panel    Order Specific Question:   Has the patient fasted?    Answer:   No  . Microalbumin/Creatinine Ratio, Urine  . Ambulatory referral to diabetic education    Referral Priority:   Routine    Referral Type:   Consultation    Referral Reason:   Specialty Services Required    Number of Visits Requested:   1  . POCT glycosylated hemoglobin (Hb A1C)  . POCT urinalysis dipstick  . POCT CBC  . POCT SEDIMENTATION RATE  Meds ordered this encounter  Medications  . atorvastatin (LIPITOR) 40 MG tablet    Sig: Take 1 tablet (40 mg total) by mouth daily.    Dispense:  30 tablet    Refill:  3  . metFORMIN (GLUCOPHAGE) 500 MG tablet    Sig: Take 1 tablet (500 mg total) by mouth 2 (two) times daily with a meal.    Dispense:  60 tablet    Refill:  3  . blood glucose meter kit and supplies KIT    Sig: Dispense based on patient and insurance preference. Use up to four times daily as directed. E11.65    Dispense:  1 each    Refill:  0    Order Specific Question:   Number of strips    Answer:   1000    Order Specific Question:   Number of lancets    Answer:   1000  . Dextromethorphan-Guaifenesin (MUCINEX DM MAXIMUM STRENGTH) 60-1200 MG TB12    Sig: Take 1 tablet by mouth at bedtime. 1 hr prior to bed    Dispense:  14 each    Refill:  1    I personally performed the services described in this documentation, which was scribed in my presence. The recorded information has been reviewed and considered, and addended by me as needed.   Delman Cheadle, M.D.  Primary Care at [facility] [address] [place], [place] [address] [phone] phone [phone] fax  01/14/17 1:27 PM

## 2017-01-14 NOTE — Patient Instructions (Addendum)
IF you received an x-ray today, you will receive an invoice from [facility]. Please contact [facility] at [phone] with questions or concerns regarding your invoice.   IF you received labwork today, you will receive an invoice from [facility]. Please contact [facility] at [phone] with questions or concerns regarding your invoice.   Our billing staff will not be able to assist you with questions regarding bills from these companies.  You will be contacted with the lab results as soon as they are available. The fastest way to get your results is to activate your My Chart account. Instructions are located on the last page of this paperwork. If you have not heard from [place] regarding the results in 2 weeks, please contact this office.     Diabetes Mellitus and Food It is important for you to manage your blood sugar (glucose) level. Your blood glucose level can be greatly affected by what you eat. Eating healthier foods in the appropriate amounts throughout the day at about the same time each day will help you control your blood glucose level. It can also help slow or prevent worsening of your diabetes mellitus. Healthy eating may even help you improve the level of your blood pressure and reach or maintain a healthy weight. General recommendations for healthful eating and cooking habits include:  Eating meals and snacks regularly. Avoid going long periods of time without eating to lose weight.  Eating a diet that consists mainly of plant-based foods, such as fruits, vegetables, nuts, legumes, and whole grains.  Using low-heat cooking methods, such as baking, instead of high-heat cooking methods, such as deep frying. Work with your dietitian to make sure you understand how to use the Nutrition Facts information on food labels. How can food affect me? Carbohydrates  Carbohydrates affect your blood glucose level more than any other type of food. Your dietitian will help  you determine how many carbohydrates to eat at each meal and teach you how to count carbohydrates. Counting carbohydrates is important to keep your blood glucose at a healthy level, especially if you are using insulin or taking certain medicines for diabetes mellitus. Alcohol  Alcohol can cause sudden decreases in blood glucose (hypoglycemia), especially if you use insulin or take certain medicines for diabetes mellitus. Hypoglycemia can be a life-threatening condition. Symptoms of hypoglycemia (sleepiness, dizziness, and disorientation) are similar to symptoms of having too much alcohol. If your health care provider has given you approval to drink alcohol, do so in moderation and use the following guidelines:  Women should not have more than one drink per day, and men should not have more than two drinks per day. One drink is equal to:  12 oz of beer.  5 oz of wine.  1 oz of hard liquor.  Do not drink on an empty stomach.  Keep yourself hydrated. Have water, diet soda, or unsweetened iced tea.  Regular soda, juice, and other mixers might contain a lot of carbohydrates and should be counted. What foods are not recommended? As you make food choices, it is important to remember that all foods are not the same. Some foods have fewer nutrients per serving than other foods, even though they might have the same number of calories or carbohydrates. It is difficult to get your body what it needs when you eat foods with fewer nutrients. Examples of foods that you should avoid that are high in calories and carbohydrates but low in nutrients include:  Trans fats (most processed  foods list trans fats on the Nutrition Facts label).  Regular soda.  Juice.  Candy.  Sweets, such as cake, pie, doughnuts, and cookies.  Fried foods. What foods can I eat? Eat nutrient-rich foods, which will nourish your body and keep you healthy. The food you should eat also will depend on several factors,  including:  The calories you need.  The medicines you take.  Your weight.  Your blood glucose level.  Your blood pressure level.  Your cholesterol level. You should eat a variety of foods, including:  Protein.  Lean cuts of meat.  Proteins low in saturated fats, such as fish, egg whites, and beans. Avoid processed meats.  Fruits and vegetables.  Fruits and vegetables that may help control blood glucose levels, such as apples, mangoes, and yams.  Dairy products.  Choose fat-free or low-fat dairy products, such as milk, yogurt, and cheese.  Grains, bread, pasta, and rice.  Choose whole grain products, such as multigrain bread, whole oats, and brown rice. These foods may help control blood pressure.  Fats.  Foods containing healthful fats, such as nuts, avocado, olive oil, canola oil, and fish. Does everyone with diabetes mellitus have the same meal plan? Because every person with diabetes mellitus is different, there is not one meal plan that works for everyone. It is very important that you meet with a dietitian who will help you create a meal plan that is just right for you. This information is not intended to replace advice given to you by your health care provider. Make sure you discuss any questions you have with your health care provider. Document Released: 06/02/2005 Document Revised: 02/11/2016 Document Reviewed: 08/02/2013 Elsevier Interactive Patient Education  2017 [place].  Blood Glucose Monitoring, Adult Monitoring your blood sugar (glucose) helps you manage your diabetes. It also helps you and your health care provider determine how well your diabetes management plan is working. Blood glucose monitoring involves checking your blood glucose as often as directed, and keeping a record (log) of your results over time. Why should I monitor my blood glucose? Checking your blood glucose regularly can:  Help you understand how food, exercise, illnesses, and  medicines affect your blood glucose.  Let you know what your blood glucose is at any time. You can quickly tell if you are having low blood glucose (hypoglycemia) or high blood glucose (hyperglycemia).  Help you and your health care provider adjust your medicines as needed. When should I check my blood glucose? Follow instructions from your health care provider about how often to check your blood glucose. This may depend on:  The type of diabetes you have.  How well-controlled your diabetes is.  Medicines you are taking. If you have type 1 diabetes:   Check your blood glucose at least 2 times a day.  Also check your blood glucose:  Before every insulin injection.  Before and after exercise.  Between meals.  2 hours after a meal.  Occasionally between 2:00 a.m. and 3:00 a.m., as directed.  Before potentially dangerous tasks, like driving or using heavy machinery.  At bedtime.  You may need to check your blood glucose more often, up to 6-10 times a day:  If you use an insulin pump.  If you need multiple daily injections (MDI).  If your diabetes is not well-controlled.  If you are ill.  If you have a history of severe hypoglycemia.  If you have a history of not knowing when your blood glucose is getting low (  hypoglycemia unawareness). If you have type 2 diabetes:   If you take insulin or other diabetes medicines, check your blood glucose at least 2 times a day.  If you are on intensive insulin therapy, check your blood glucose at least 4 times a day. Occasionally, you may also need to check between 2:00 a.m. and 3:00 a.m., as directed.  Also check your blood glucose:  Before and after exercise.  Before potentially dangerous tasks, like driving or using heavy machinery.  You may need to check your blood glucose more often if:  Your medicine is being adjusted.  Your diabetes is not well-controlled.  You are ill. What is a blood glucose log?  A blood  glucose log is a record of your blood glucose readings. It helps you and your health care provider:  Look for patterns in your blood glucose over time.  Adjust your diabetes management plan as needed.  Every time you check your blood glucose, write down your result and notes about things that may be affecting your blood glucose, such as your diet and exercise for the day.  Most glucose meters store a record of glucose readings in the meter. Some meters allow you to download your records to a computer. How do I check my blood glucose? Follow these steps to get accurate readings of your blood glucose: Supplies needed    Blood glucose meter.  Test strips for your meter. Each meter has its own strips. You must use the strips that come with your meter.  A needle to prick your finger (lancet). Do not use lancets more than once.  A device that holds the lancet (lancing device).  A journal or log book to write down your results. Procedure   Wash your hands with soap and water.  Prick the side of your finger (not the tip) with the lancet. Use a different finger each time.  Gently rub the finger until a small drop of blood appears.  Follow instructions that come with your meter for inserting the test strip, applying blood to the strip, and using your blood glucose meter.  Write down your result and any notes. Alternative testing sites   Some meters allow you to use areas of your body other than your finger (alternative sites) to test your blood.  If you think you may have hypoglycemia, or if you have hypoglycemia unawareness, do not use alternative sites. Use your finger instead.  Alternative sites may not be as accurate as the fingers, because blood flow is slower in these areas. This means that the result you get may be delayed, and it may be different from the result that you would get from your finger.  The most common alternative sites are:  Forearm.  Thigh.  Palm of the  hand. Additional tips   Always keep your supplies with you.  If you have questions or need help, all blood glucose meters have a 24-hour "hotline" number that you can call. You may also contact your health care provider.  After you use a few boxes of test strips, adjust (calibrate) your blood glucose meter by following instructions that came with your meter. This information is not intended to replace advice given to you by your health care provider. Make sure you discuss any questions you have with your health care provider. Document Released: 09/08/2003 Document Revised: 03/25/2016 Document Reviewed: 02/15/2016 Elsevier Interactive Patient Education  2017 [place].  Diabetes Mellitus and Standards of Medical Care Managing diabetes (diabetes mellitus) can  be complicated. Your diabetes treatment may be managed by a team of health care providers, including:  A diet and nutrition specialist (registered dietitian).  A nurse.  A certified diabetes educator (CDE).  A diabetes specialist (endocrinologist).  An eye doctor.  A primary care provider.  A dentist. Your health care providers follow a schedule in order to help you get the best quality of care. The following schedule is a general guideline for your diabetes management plan. Your health care providers may also give you more specific instructions. HbA1c ( hemoglobin A1c) test This test provides information about blood sugar (glucose) control over the previous 2-3 months. It is used to check whether your diabetes management plan needs to be adjusted.  If you are meeting your treatment goals, this test is done at least 2 times a year.  If you are not meeting treatment goals or if your treatment goals have changed, this test is done 4 times a year. Blood pressure test  This test is done at every routine medical visit. For most people, the goal is less than 130/80. Ask your health care provider what your goal blood pressure  should be. Dental and eye exams  Visit your dentist two times a year.  If you have type 1 diabetes, get an eye exam 3-5 years after you are diagnosed, and then once a year after your first exam.  If you were diagnosed with type 1 diabetes as a child, get an eye exam when you are age 8 or older and have had diabetes for 3-5 years. After the first exam, you should get an eye exam once a year.  If you have type 2 diabetes, have an eye exam as soon as you are diagnosed, and then once a year after your first exam. Foot care exam  Visual foot exams are done at every routine medical visit. The exams check for cuts, bruises, redness, blisters, sores, or other problems with the feet.  A complete foot exam is done by your health care provider once a year. This exam includes an inspection of the structure and skin of your feet, and a check of the pulses and sensation in your feet.  Type 1 diabetes: Get your first exam 3-5 years after diagnosis.  Type 2 diabetes: Get your first exam as soon as you are diagnosed.  Check your feet every day for cuts, bruises, redness, blisters, or sores. If you have any of these or other problems that are not healing, contact your health care provider. Kidney function test ( urine microalbumin)  This test is done once a year.  Type 1 diabetes: Get your first test 5 years after diagnosis.  Type 2 diabetes: Get your first test as soon as you are diagnosed.  If you have chronic kidney disease (CKD), get a serum creatinine and estimated glomerular filtration rate (eGFR) test once a year. Lipid profile (cholesterol, HDL, LDL, triglycerides)  This test should be done when you are diagnosed with diabetes, and every 5 years after the first test. If you are on medicines to lower your cholesterol, you may need to get this test done every year.  The goal for LDL is less than 100 mg/dL (5.5 mmol/L). If you are at high risk, the goal is less than 70 mg/dL (3.9  mmol/L).    The goal for triglycerides is less than 150 mg/dL (8.3 mmol/L). Immunizations  The yearly flu (influenza) vaccine is recommended for everyone 6 months or older who has diabetes.  The pneumonia (pneumococcal) vaccine is recommended for everyone 2 years or older who has diabetes. If you are 22 or older, you may get the pneumonia vaccine as a series of two separate shots.  The hepatitis B vaccine is recommended for adults shortly after they have been diagnosed with diabetes.     Mental and emotional health  Screening for symptoms of eating disorders, anxiety, and depression is recommended at the time of diagnosis and afterward as needed. If your screening shows that you have symptoms (you have a positive screening result), you may need further evaluation and be referred to a mental health care provider. Diabetes self-management education  Education about how to manage your diabetes is recommended at diagnosis and ongoing as needed. Treatment plan  Your treatment plan will be reviewed at every medical visit. Summary  Managing diabetes (diabetes mellitus) can be complicated. Your diabetes treatment may be managed by a team of health care providers.  Your health care providers follow a schedule in order to help you get the best quality of care.  Standards of care including having regular physical exams, blood tests, blood pressure monitoring, immunizations, screening tests, and education about how to manage your diabetes.  Your health care providers may also give you more specific instructions based on your individual health. This information is not intended to replace advice given to you by your health care provider. Make sure you discuss any questions you have with your health care provider. Document Released: 07/03/2009 Document Revised: 06/03/2016 Document Reviewed: 06/03/2016 Elsevier Interactive Patient Education  2017 [employer].

## 2017-01-15 LAB — BASIC METABOLIC PANEL
BUN / CREAT RATIO: 13 (ref 9–20)
BUN: 14 mg/dL (ref 6–24)
CO2: 22 mmol/L (ref 18–29)
CREATININE: 1.06 mg/dL (ref 0.76–1.27)
Calcium: 9.9 mg/dL (ref 8.7–10.2)
Chloride: 98 mmol/L (ref 96–106)
GFR calc Af Amer: 93 mL/min/{1.73_m2} (ref >59)
GFR, EST NON AFRICAN AMERICAN: 80 mL/min/{1.73_m2} (ref >59)
GLUCOSE: 140 mg/dL — AB (ref 65–99)
Potassium: 3.9 mmol/L (ref 3.5–5.2)
Sodium: 138 mmol/L (ref 134–144)

## 2017-01-15 LAB — CBC
HEMOGLOBIN: 16.2 g/dL (ref 13.0–17.7)
Hematocrit: 47.4 % (ref 37.5–51.0)
MCH: 29.8 pg (ref 26.6–33.0)
MCHC: 34.2 g/dL (ref 31.5–35.7)
MCV: 87 fL (ref 79–97)
PLATELETS: 223 10*3/uL (ref 150–379)
RBC: 5.44 x10E6/uL (ref 4.14–5.80)
RDW: 14.4 % (ref 12.3–15.4)
WBC: 5.7 10*3/uL (ref 3.4–10.8)

## 2017-01-15 LAB — MICROALBUMIN / CREATININE URINE RATIO
CREATININE, UR: 147.6 mg/dL
Microalb/Creat Ratio: 3.6 mg/g creat (ref 0.0–30.0)
Microalbumin, Urine: 5.3 ug/mL

## 2017-01-17 ENCOUNTER — Encounter: Payer: Self-pay | Admitting: Family Medicine

## 2017-02-09 ENCOUNTER — Other Ambulatory Visit: Payer: Self-pay | Admitting: Family Medicine

## 2017-02-13 ENCOUNTER — Other Ambulatory Visit: Payer: Self-pay | Admitting: Family Medicine

## 2017-02-18 ENCOUNTER — Other Ambulatory Visit: Payer: Self-pay | Admitting: Family Medicine

## 2017-04-15 ENCOUNTER — Ambulatory Visit (INDEPENDENT_AMBULATORY_CARE_PROVIDER_SITE_OTHER): Payer: BLUE CROSS/BLUE SHIELD | Admitting: Family Medicine

## 2017-04-15 ENCOUNTER — Encounter: Payer: Self-pay | Admitting: Family Medicine

## 2017-04-15 VITALS — BP 129/84 | HR 79 | Temp 98.8°F | Resp 17 | Ht 67.0 in | Wt 162.4 lb

## 2017-04-15 DIAGNOSIS — E1165 Type 2 diabetes mellitus with hyperglycemia: Secondary | ICD-10-CM | POA: Diagnosis not present

## 2017-04-15 DIAGNOSIS — I10 Essential (primary) hypertension: Secondary | ICD-10-CM

## 2017-04-15 DIAGNOSIS — Z5181 Encounter for therapeutic drug level monitoring: Secondary | ICD-10-CM

## 2017-04-15 DIAGNOSIS — B070 Plantar wart: Secondary | ICD-10-CM

## 2017-04-15 DIAGNOSIS — F17200 Nicotine dependence, unspecified, uncomplicated: Secondary | ICD-10-CM

## 2017-04-15 DIAGNOSIS — E7800 Pure hypercholesterolemia, unspecified: Secondary | ICD-10-CM | POA: Diagnosis not present

## 2017-04-15 LAB — POCT GLYCOSYLATED HEMOGLOBIN (HGB A1C): Hemoglobin A1C: 6.5

## 2017-04-15 MED ORDER — METFORMIN HCL 500 MG PO TABS: 500.0000 mg | ORAL_TABLET | Freq: Every day | ORAL | 1 refills | Status: DC

## 2017-04-15 NOTE — Progress Notes (Signed)
Subjective:  By signing my name below, I, Moises Blood, attest that this documentation has been prepared under the direction and in the presence of Delman Cheadle, MD. Electronically Signed: Moises Blood, [place]. 04/15/2017 , 11:50 AM .  Patient was seen in Room 10 .   Patient ID: Jose Kelley, male    DOB: Feb 05, 1966, 51 y.o.   MRN: [id number] Chief Complaint  Patient presents with  . Diabetes    3 month follow-up  . Hypertension  . Hyperlipidemia   HPI Jose Kelley is a 51 y.o. male who presents to Primary Care at [facility] for follow up. He is not fasting today.   Plantar Wart He does complain of a warty area forming at the bottom of his left foot.   Diabetes He states the metformin has been helping him lose weight, lost about 8 lbs. His appetite is still doing well. He denies side effects. He's been avoiding starch, switched to wheat bread from white bread, and cut out sodas. He denies any symptomatic lows. His lowest blood sugar reading was 117, first thing in the morning. His biggest meal is dinner, but has noticed reduced amount of food compared to before.   Wt Readings from Last 3 Encounters:  04/15/17 162 lb 6 oz (73.7 kg)  01/14/17 170 lb (77.1 kg)  12/17/16 170 lb 4 oz (77.2 kg)   HTN He's been cutting down on his smoking, but ultimately still trying to stop. He's smoking about 3-4 cigarettes a day. He started smoking around 25 years ago. He has approximately 15 pack-year history. He denies any side effects with his medications.   No past medical history on file. Prior to Admission medications   Medication Sig Start Date End Date Taking? Authorizing Provider  atorvastatin (LIPITOR) 40 MG tablet Take 1 tablet (40 mg total) by mouth daily. 01/14/17  Yes Shawnee Knapp, MD  blood glucose meter kit and supplies KIT Dispense based on patient and insurance preference. Use up to four times daily as directed. E11.65 01/14/17  Yes Shawnee Knapp, MD  lisinopril-hydrochlorothiazide  (PRINZIDE,ZESTORETIC) 20-25 MG tablet TAKE 1 TABLET BY MOUTH DAILY. 02/18/17  Yes Shawnee Knapp, MD  metFORMIN (GLUCOPHAGE) 500 MG tablet Take 1 tablet (500 mg total) by mouth 2 (two) times daily with a meal. 01/14/17  Yes Shawnee Knapp, MD   No Known Allergies  Review of Systems  Constitutional: Negative for fatigue and unexpected weight change.  Eyes: Negative for visual disturbance.  Respiratory: Negative for cough, chest tightness and shortness of breath.   Cardiovascular: Negative for chest pain, palpitations and leg swelling.  Gastrointestinal: Negative for abdominal pain and blood in stool.  Skin:       Plantar wart (left foot)  Neurological: Negative for dizziness, light-headedness and headaches.       Objective:   Physical Exam  Constitutional: He is oriented to person, place, and time. He appears well-developed and well-nourished. No distress.  HENT:  Head: Normocephalic and atraumatic.  Eyes: Pupils are equal, round, and reactive to light. EOM are normal.  Neck: Neck supple.  Cardiovascular: Normal rate.   Pulmonary/Chest: Effort normal. No respiratory distress.  Musculoskeletal: Normal range of motion.  Neurological: He is alert and oriented to person, place, and time.  Skin: Skin is warm and dry.  Plantar wart with visible core on plantar aspect of pad of left great toe near IP joint  Psychiatric: He has a normal mood and affect. His behavior is normal.  Nursing  note and vitals reviewed.   BP 129/84   Pulse 79   Temp 98.8 F (37.1 C) (Oral)   Resp 17   Ht _0  (1.702 m)   Wt 162 lb 6 oz (73.7 kg)   SpO2 96%   BMI 25.43 kg/m   Results for orders placed or performed in visit on 04/15/17  POCT glycosylated hemoglobin (Hb A1C)  Result Value Ref Range   Hemoglobin A1C 6.5    Procedure: Risk and benefits reviewed; consent obtained. Plantar wart with visible core on plantar aspect of pad of left great toe near IP joint, debrided with 15 blade with good result; core  removed. Cryosurgery tolerated.      Assessment & Plan:   1. Essential hypertension   2. Type 2 diabetes mellitus with hyperglycemia, without long-term current use of insulin (HCC)   3. Pure hypercholesterolemia   4. Tobacco use disorder   5. Medication monitoring encounter   6. Plantar wart of left foot     Orders Placed This Encounter  Procedures  . Comprehensive metabolic panel  . Lipid panel  . POCT glycosylated hemoglobin (Hb A1C)    Meds ordered this encounter  Medications  . metFORMIN (GLUCOPHAGE) 500 MG tablet    Sig: Take 1 tablet (500 mg total) by mouth daily with breakfast. Take second tab before dinner if needed as directed by physician    Dispense:  90 tablet    Refill:  1    I personally performed the services described in this documentation, which was scribed in my presence. The recorded information has been reviewed and considered, and addended by me as needed.   Delman Cheadle, M.D.  Primary Care at [facility] [address] [place], [place] [address] [phone] phone [phone] fax  04/17/17 10:12 PM

## 2017-04-15 NOTE — Patient Instructions (Addendum)
Remember to be FASTING for your next visit - nothing to eat or drink other than water or black coffee since midnight the evening prior.     IF you received an x-ray today, you will receive an invoice from [facility]. Please contact [facility] at [phone] with questions or concerns regarding your invoice.   IF you received labwork today, you will receive an invoice from [place]. Please contact [facility] at [phone] with questions or concerns regarding your invoice.   Our billing staff will not be able to assist you with questions regarding bills from these companies.  You will be contacted with the lab results as soon as they are available. The fastest way to get your results is to activate your My Chart account. Instructions are located on the last page of this paperwork. If you have not heard from [place] regarding the results in 2 weeks, please contact this office.     Plantar Warts Warts are small growths on the skin. They can occur on various areas of the body. When they occur on the underside (sole) of the foot, they are called plantar warts. Plantar warts often occur in groups, with several small warts around a larger growth. They tend to develop over areas of pressure, such as the heel or the ball of the foot. Most warts are not painful, and they usually do not cause problems. However, plantar warts may cause pain when you walk because pressure is applied to them. Warts often go away on their own in time. Various treatments may be done if needed. Sometimes, warts go away and then they come back again. What are the causes? Plantar warts are caused by a type of virus that is called human papillomavirus (HPV). HPV attacks a break in the skin of the foot. Walking barefoot can lead to exposure to the virus. These warts may spread to other areas of the sole. They spread to other areas of the body only through direct contact. What increases the risk? Plantar warts are more  likely to develop in:  People who are 39-49 years of age.  People who use public showers or locker rooms.  People who have a weakened body defense system (immune system).  What are the signs or symptoms? Plantar warts may be flat or slightly raised. They may grow into the deeper layers of skin or rise above the surface of the skin. Most plantar warts have a rough surface. They may cause pain when you use your foot to support your body weight. How is this diagnosed? A plantar wart can usually be diagnosed from its appearance. In some cases, a tissue sample may be removed (biopsy) to be looked at under a microscope. How is this treated? In many cases, warts do not need treatment. Without treatment, they often go away over a period of many months to a couple years. If treatment is needed, options may include:  Applying medicated solutions, creams, or patches to the wart. These may be over-the-counter or prescription medicines that make the skin soft so that layers will gradually shed away. In many cases, the medicine is applied one or two times per day and covered with a bandage.  Putting duct tape over the top of the wart (occlusion). You will leave the tape in place for as long as told by your health care provider, then you will replace it with a new strip of tape. This is done until the wart goes away.  Freezing the wart with liquid nitrogen (  cryotherapy).  Burning the wart with: ? Laser treatment. ? An electrified probe (electrocautery).  Injection of a medicine (Candida antigen) into the wart to help the body's immune system to fight off the wart.  Surgery to remove the wart.  Follow these instructions at home:  Apply medicated creams or solutions only as told by your health care provider. This may involve: ? Soaking the affected area in warm water. ? Removing the top layer of softened skin before you apply the medicine. A pumice stone works well for removing the  tissue. ? Applying a bandage over the affected area after you apply the medicine. ? Repeating the process daily or as told by your health care provider.  Do not scratch or pick at a wart.  Wash your hands after you touch a wart.  If a wart is painful, try applying a bandage with a hole in the middle over the wart. The helps to take pressure off the wart.  Keep all follow-up visits as told by your health care provider. This is important. How is this prevented? Take these actions to help prevent warts:  Wear shoes and socks. Change your socks daily.  Keep your feet clean and dry.  Check your feet regularly.  Avoid direct contact with warts on other people.  Contact a health care provider if:  Your warts do not improve after treatment.  You have redness, swelling, or pain at the site of a wart.  You have bleeding from a wart that does not stop with light pressure.  You have diabetes and you develop a wart. This information is not intended to replace advice given to you by your health care provider. Make sure you discuss any questions you have with your health care provider. Document Released: 11/26/2003 Document Revised: 02/11/2016 Document Reviewed: 12/01/2014 Elsevier Interactive Patient Education  [employer].

## 2017-04-16 LAB — COMPREHENSIVE METABOLIC PANEL
A/G RATIO: 1.7 (ref 1.2–2.2)
ALBUMIN: 4.5 g/dL (ref 3.5–5.5)
ALK PHOS: 65 IU/L (ref 39–117)
ALT: 16 IU/L (ref 0–44)
AST: 18 IU/L (ref 0–40)
BILIRUBIN TOTAL: 0.5 mg/dL (ref 0.0–1.2)
BUN / CREAT RATIO: 12 (ref 9–20)
BUN: 12 mg/dL (ref 6–24)
CHLORIDE: 102 mmol/L (ref 96–106)
CO2: 25 mmol/L (ref 20–29)
Calcium: 10.5 mg/dL — ABNORMAL HIGH (ref 8.7–10.2)
Creatinine, Ser: 0.99 mg/dL (ref 0.76–1.27)
GFR calc Af Amer: 101 mL/min/{1.73_m2} (ref >59)
GFR calc non Af Amer: 87 mL/min/{1.73_m2} (ref >59)
GLUCOSE: 114 mg/dL — AB (ref 65–99)
Globulin, Total: 2.7 g/dL (ref 1.5–4.5)
POTASSIUM: 4.2 mmol/L (ref 3.5–5.2)
Sodium: 141 mmol/L (ref 134–144)
Total Protein: 7.2 g/dL (ref 6.0–8.5)

## 2017-04-16 LAB — LIPID PANEL
CHOLESTEROL TOTAL: 123 mg/dL (ref 100–199)
Chol/HDL Ratio: 3.3 ratio (ref 0.0–5.0)
HDL: 37 mg/dL — ABNORMAL LOW (ref >39)
LDL Calculated: 69 mg/dL (ref 0–99)
Triglycerides: 86 mg/dL (ref 0–149)
VLDL CHOLESTEROL CAL: 17 mg/dL (ref 5–40)

## 2017-04-21 ENCOUNTER — Other Ambulatory Visit: Payer: Self-pay | Admitting: Family Medicine

## 2017-04-23 ENCOUNTER — Other Ambulatory Visit: Payer: Self-pay | Admitting: Family Medicine

## 2017-11-02 ENCOUNTER — Other Ambulatory Visit: Payer: Self-pay | Admitting: Family Medicine

## 2017-12-13 ENCOUNTER — Other Ambulatory Visit: Payer: Self-pay | Admitting: Family Medicine

## 2018-02-03 ENCOUNTER — Other Ambulatory Visit: Payer: Self-pay | Admitting: Family Medicine

## 2018-03-13 ENCOUNTER — Other Ambulatory Visit: Payer: Self-pay | Admitting: Family Medicine

## 2018-04-10 ENCOUNTER — Telehealth: Payer: Self-pay | Admitting: Family Medicine

## 2018-04-11 NOTE — Telephone Encounter (Signed)
Left message on voice mail to call office to schedule CPE

## 2018-04-12 NOTE — Telephone Encounter (Signed)
Pt states he is out of town and doesn't know when he will be back in.

## 2018-04-14 ENCOUNTER — Other Ambulatory Visit: Payer: Self-pay | Admitting: Family Medicine

## 2018-05-09 ENCOUNTER — Other Ambulatory Visit: Payer: Self-pay | Admitting: Family Medicine

## 2018-05-09 NOTE — Telephone Encounter (Signed)
Patient called and advised he will need to schedule an appointment before more refills of his Lisinopril-HCTZ. I asked if I can schedule an appointment, he says "I am out of town right now working on a job, so I will call when I come back to [place]." I asked is he out of medication, he says "yes." I asked is there a pharmacy where he is that he would like to use so we can call in a 15 day supply, he says "no, I'll just wait until I get back to [place]." I advised to keep check on his BP and if it starts going up to go to the nearest ED or UC, he verbalized understanding.

## 2018-05-14 ENCOUNTER — Other Ambulatory Visit: Payer: Self-pay | Admitting: Family Medicine

## 2018-05-17 ENCOUNTER — Telehealth: Payer: Self-pay | Admitting: Family Medicine

## 2018-05-17 NOTE — Telephone Encounter (Signed)
Copied from [place] [id number]. Topic: General - Other >> May 17, 2018 10:20 AM Ivar Drape wrote: Reason for CRM:   Patient would like a return call from the doctor about his appointment for his meds [phone].

## 2018-05-24 NOTE — Telephone Encounter (Signed)
Per pt called last week for shaw as he was out of all meds but since no return call he can wait to get refills at appt with [place]. Dgaddy, CMA

## 2018-05-25 ENCOUNTER — Ambulatory Visit: Payer: BLUE CROSS/BLUE SHIELD | Admitting: Physician Assistant

## 2018-05-25 ENCOUNTER — Encounter: Payer: Self-pay | Admitting: Physician Assistant

## 2018-05-25 ENCOUNTER — Other Ambulatory Visit: Payer: Self-pay

## 2018-05-25 VITALS — BP 142/82 | HR 75 | Temp 98.0°F | Resp 16 | Ht 68.31 in | Wt 159.0 lb

## 2018-05-25 DIAGNOSIS — Z23 Encounter for immunization: Secondary | ICD-10-CM

## 2018-05-25 DIAGNOSIS — E1165 Type 2 diabetes mellitus with hyperglycemia: Secondary | ICD-10-CM

## 2018-05-25 DIAGNOSIS — I10 Essential (primary) hypertension: Secondary | ICD-10-CM | POA: Diagnosis not present

## 2018-05-25 DIAGNOSIS — Z1211 Encounter for screening for malignant neoplasm of colon: Secondary | ICD-10-CM

## 2018-05-25 DIAGNOSIS — F17200 Nicotine dependence, unspecified, uncomplicated: Secondary | ICD-10-CM | POA: Diagnosis not present

## 2018-05-25 DIAGNOSIS — E7800 Pure hypercholesterolemia, unspecified: Secondary | ICD-10-CM | POA: Diagnosis not present

## 2018-05-25 LAB — POCT GLYCOSYLATED HEMOGLOBIN (HGB A1C): Hemoglobin A1C: 6.2 % — AB (ref 4.0–5.6)

## 2018-05-25 MED ORDER — ATORVASTATIN CALCIUM 40 MG PO TABS: 40.0000 mg | ORAL_TABLET | Freq: Every day | ORAL | 3 refills | Status: DC

## 2018-05-25 MED ORDER — LISINOPRIL-HYDROCHLOROTHIAZIDE 20-25 MG PO TABS: 1.0000 | ORAL_TABLET | Freq: Every day | ORAL | 1 refills | Status: DC

## 2018-05-25 MED ORDER — METFORMIN HCL 500 MG PO TABS: 500.0000 mg | ORAL_TABLET | Freq: Every day | ORAL | 1 refills | Status: DC

## 2018-05-25 NOTE — Progress Notes (Signed)
MRN: [id number]  Subjective:   Jose Kelley is a 52 y.o. male who presents for follow up on chronic medical conditions. Has been out of medication for 2 weeks.  1) T2DM: Typically managed with metformin '500mg'$  daily. Admits good compliance. Denies adverse effects including metallic taste, hypoglycemia, nausea, vomiting. Patient is checking home blood sugars. Home blood sugar records: 110s. Current symptoms include none. Patient denies foot ulcerations, nausea, paresthesia of the feet, polydipsia, polyuria, visual disturbances, vomiting and weight loss. Patient is checking their feet daily. No foot concerns. Last diabetic eye exam eye exam: never, will be getting eye insurance next month. Denies smoking or alcohol use. Known diabetic complications: none.Immunizations: Flu vaccine:2 years ago,  Shingles: never, pneumococal vaccine:never   HTN: Typically managed with lisinopril-hctz 20-'25mg'$  daily.  Patient is checking blood pressure at home, range is 469G systolic when he is taking it regularly.  Denies lightheadedness, dizziness, chronic headache, double vision, chest pain, shortness of breath, heart racing, palpitations, nausea, vomiting, abdominal pain, hematuria, lower leg swelling. Lifestyle: Avoiding excessive salt intake. Mostly eats baked or grilled chicken and shrimp. Likes vegetables.  Trying to exercise on a regular basis, walks daily.  Smokes 0.5pdd per day. Occasional alcohol use.l.   HLD: Currently managed with atorvastatin 40 mg daily.  Diet as above.  Patient Active Problem List   Diagnosis Date Noted  . Type 2 diabetes mellitus with hyperglycemia, without long-term current use of insulin ([place]) 01/14/2017  . Hyperlipidemia 01/14/2017  . Tobacco use disorder 01/14/2017  . Essential hypertension 01/14/2017  History reviewed. No pertinent past medical history. History reviewed. No pertinent surgical history. Social History   Socioeconomic History  . Marital status: Married   Spouse name: Not on file  . Number of children: Not on file  . Years of education: Not on file  . Highest education level: Not on file  Occupational History  . Not on file  Social Needs  . Financial resource strain: Not on file  . Food insecurity:    Worry: Not on file    Inability: Not on file  . Transportation needs:    Medical: Not on file    Non-medical: Not on file  Tobacco Use  . Smoking status: Current Every Day Smoker    Packs/day: 0.50    Years: 10.00    Pack years: 5.00  . Smokeless tobacco: Former Network engineer and Sexual Activity  . Alcohol use: No  . Drug use: No  . Sexual activity: Not on file  Lifestyle  . Physical activity:    Days per week: Not on file    Minutes per session: Not on file  . Stress: Not on file  Relationships  . Social connections:    Talks on phone: Not on file    Gets together: Not on file    Attends religious service: Not on file    Active member of club or organization: Not on file    Attends meetings of clubs or organizations: Not on file    Relationship status: Not on file  . Intimate partner violence:    Fear of current or ex partner: Not on file    Emotionally abused: Not on file    Physically abused: Not on file    Forced sexual activity: Not on file  Other Topics Concern  . Not on file  Social History Narrative  . Not on file      Objective:   PHYSICAL EXAM BP (!) 142/82  Pulse 75   Temp 98 F (36.7 C) (Oral)   Resp 16   Ht 5' 8.31" (1.735 m)   Wt 159 lb (72.1 kg)   SpO2 98%   BMI 23.96 kg/m   Physical Exam  Constitutional: He is oriented to person, place, and time. He appears well-developed and well-nourished. No distress.  HENT:  Head: Normocephalic and atraumatic.  Mouth/Throat: Uvula is midline, oropharynx is clear and moist and mucous membranes are normal. No tonsillar exudate.  Eyes: Pupils are equal, round, and reactive to light. Conjunctivae, EOM and lids are normal.  Neck: Normal range of  motion.  Cardiovascular: Normal rate, regular rhythm, normal heart sounds and intact distal pulses.  Pulmonary/Chest: Effort normal and breath sounds normal.  Abdominal: Soft. Normal appearance and bowel sounds are normal. There is no tenderness.  Musculoskeletal:       Right lower leg: He exhibits no edema.       Left lower leg: He exhibits no edema.  Neurological: He is alert and oriented to person, place, and time.  Skin: Skin is warm and dry.  Psychiatric: He has a normal mood and affect.  Vitals reviewed.   Diabetic Foot Exam - Simple   Simple Foot Form Visual Inspection No deformities, no ulcerations, no other skin breakdown bilaterally:  Yes Sensation Testing Intact to touch and monofilament testing bilaterally:  Yes Pulse Check Posterior Tibialis and Dorsalis pulse intact bilaterally:  Yes Comments     Results for orders placed or performed in visit on 05/25/18 (from the past 24 hour(s))  POCT glycosylated hemoglobin (Hb A1C)     Status: Abnormal   Collection Time: 05/25/18  2:16 PM  Result Value Ref Range   Hemoglobin A1C 6.2 (A) 4.0 - 5.6 %   HbA1c POC (<> result, manual entry)     HbA1c, POC (prediabetic range)     HbA1c, POC (controlled diabetic range)      Assessment and Plan :  1. Type 2 diabetes mellitus with hyperglycemia, without long-term current use of insulin (HCC) Well controlled. A1C 6.2.  Further labs pending.  Recommended to continue healthy lifestyle measures with diet and exercise.  Continue metformin at 500 mg daily.  Had discussion with patient that at his follow-up visit in 6 months with Dr. Brigitte Pulse if his A1c is still controlled at this level, he may want to discuss trial off of metformin to see if he can control diabetes with diet and exercise. - CBC with Differential/Platelet - CMP14+EGFR - Lipid panel - TSH - POCT glycosylated hemoglobin (Hb A1C) - Urinalysis, dipstick only - Microalbumin/Creatinine Ratio, Urine - Ambulatory referral to  Ophthalmology - Pneumococcal polysaccharide vaccine 23-valent greater than or equal to 2yo subcutaneous/IM - metFORMIN (GLUCOPHAGE) 500 MG tablet; Take 1 tablet (500 mg total) by mouth daily with breakfast. Take second tab before dinner if needed as directed by physician  Dispense: 90 tablet; Refill: 1  2. Essential hypertension Uncontrolled at this time.  Likely due to the fact that patient has been out of medication for 2 weeks.  He is asymptomatic.  His reported BP readings while on medication are all within normal range.  Recommend continuing current medication regimen.  Given strict ED precautions.  Plan to follow-up in 6 months. - CMP14+EGFR - Lipid panel - TSH - Urinalysis, dipstick only - lisinopril-hydrochlorothiazide (PRINZIDE,ZESTORETIC) 20-25 MG tablet; Take 1 tablet by mouth daily.  Dispense: 90 tablet; Refill: 1  3. Tobacco use disorder Given educational material on smoking cessation.  4. Pure hypercholesterolemia Labs pending. - Lipid panel - atorvastatin (LIPITOR) 40 MG tablet; Take 1 tablet (40 mg total) by mouth daily.  Dispense: 90 tablet; Refill: 3  5. Screen for colon cancer - Cologuard  Tenna Delaine, PA-C  Primary Care at [facility] 05/25/2018 2:19 PM

## 2018-05-25 NOTE — Patient Instructions (Addendum)
I recommend getting the flu vaccine within the next couple months!   You can get the shingles vaccine at your pharmacy. We can update your tetanus vaccine at your next visit.   In terms of high blood pressure, restart medication and continue checking it as you are. Goal is 130/80.  For diabetes, continue medication as you are.   If you have lab work done today you will be contacted with your lab results within the next 2 weeks.  If you have not heard from [place] then please contact us. The fastest way to get your results is to register for My Chart.   Steps to Quit Smoking Smoking tobacco can be bad for your health. It can also affect almost every organ in your body. Smoking puts you and people around you at risk for many serious long-lasting (chronic) diseases. Quitting smoking is hard, but it is one of the best things that you can do for your health. It is never too late to quit. What are the benefits of quitting smoking? When you quit smoking, you lower your risk for getting serious diseases and conditions. They can include:  Lung cancer or lung disease.  Heart disease.  Stroke.  Heart attack.  Not being able to have children (infertility).  Weak bones (osteoporosis) and broken bones (fractures).  If you have coughing, wheezing, and shortness of breath, those symptoms may get better when you quit. You may also get sick less often. If you are pregnant, quitting smoking can help to lower your chances of having a baby of low birth weight. What can I do to help me quit smoking? Talk with your doctor about what can help you quit smoking. Some things you can do (strategies) include:  Quitting smoking totally, instead of slowly cutting back how much you smoke over a period of time.  Going to in-person counseling. You are more likely to quit if you go to many counseling sessions.  Using resources and support systems, such as: ? Database administrator with a Social worker. ? Phone quitlines. ? Event organiser. ? Support groups or group counseling. ? Text messaging programs. ? Mobile phone apps or applications.  Taking medicines. Some of these medicines may have nicotine in them. If you are pregnant or breastfeeding, do not take any medicines to quit smoking unless your doctor says it is okay. Talk with your doctor about counseling or other things that can help you.  Talk with your doctor about using more than one strategy at the same time, such as taking medicines while you are also going to in-person counseling. This can help make quitting easier. What things can I do to make it easier to quit? Quitting smoking might feel very hard at first, but there is a lot that you can do to make it easier. Take these steps:  Talk to your family and friends. Ask them to support and encourage you.  Call phone quitlines, reach out to support groups, or work with a Social worker.  Ask people who smoke to not smoke around you.  Avoid places that make you want (trigger) to smoke, such as: ? Bars. ? Parties. ? Smoke-break areas at work.  Spend time with people who do not smoke.  Lower the stress in your life. Stress can make you want to smoke. Try these things to help your stress: ? Getting regular exercise. ? Deep-breathing exercises. ? Yoga. ? Meditating. ? Doing a body scan. To do this, close your eyes, focus on one  area of your body at a time from head to toe, and notice which parts of your body are tense. Try to relax the muscles in those areas.  Download or buy apps on your mobile phone or tablet that can help you stick to your quit plan. There are many free apps, such as QuitGuide from the [employer] Office manager for Disease Control and Prevention). You can find more support from smokefree.gov and other websites.  This information is not intended to replace advice given to you by your health care provider. Make sure you discuss any questions you have with your health care provider. Document  Released: 07/02/2009 Document Revised: 05/03/2016 Document Reviewed: 01/20/2015 Elsevier Interactive Patient Education  2018 [employer].  IF you received an x-ray today, you will receive an invoice from [facility]. Please contact [facility] at [phone] with questions or concerns regarding your invoice.   IF you received labwork today, you will receive an invoice from [place]. Please contact [facility] at [phone] with questions or concerns regarding your invoice.   Our billing staff will not be able to assist you with questions regarding bills from these companies.  You will be contacted with the lab results as soon as they are available. The fastest way to get your results is to activate your My Chart account. Instructions are located on the last page of this paperwork. If you have not heard from [place] regarding the results in 2 weeks, please contact this office.

## 2018-05-26 LAB — CBC WITH DIFFERENTIAL/PLATELET
BASOS ABS: 0 10*3/uL (ref 0.0–0.2)
Basos: 1 %
EOS (ABSOLUTE): 0.1 10*3/uL (ref 0.0–0.4)
Eos: 2 %
HEMOGLOBIN: 15.1 g/dL (ref 13.0–17.7)
Hematocrit: 45 % (ref 37.5–51.0)
IMMATURE GRANS (ABS): 0 10*3/uL (ref 0.0–0.1)
IMMATURE GRANULOCYTES: 0 %
LYMPHS: 46 %
Lymphocytes Absolute: 2.1 10*3/uL (ref 0.7–3.1)
MCH: 29.4 pg (ref 26.6–33.0)
MCHC: 33.6 g/dL (ref 31.5–35.7)
MCV: 88 fL (ref 79–97)
MONOCYTES: 8 %
Monocytes Absolute: 0.4 10*3/uL (ref 0.1–0.9)
NEUTROS PCT: 43 %
Neutrophils Absolute: 2 10*3/uL (ref 1.4–7.0)
PLATELETS: 206 10*3/uL (ref 150–450)
RBC: 5.14 x10E6/uL (ref 4.14–5.80)
RDW: 15.1 % (ref 12.3–15.4)
WBC: 4.6 10*3/uL (ref 3.4–10.8)

## 2018-05-26 LAB — TSH: TSH: 0.835 u[IU]/mL (ref 0.450–4.500)

## 2018-05-26 LAB — LIPID PANEL
CHOL/HDL RATIO: 4.5 ratio (ref 0.0–5.0)
Cholesterol, Total: 192 mg/dL (ref 100–199)
HDL: 43 mg/dL (ref >39)
LDL CALC: 133 mg/dL — AB (ref 0–99)
Triglycerides: 78 mg/dL (ref 0–149)
VLDL Cholesterol Cal: 16 mg/dL (ref 5–40)

## 2018-05-26 LAB — CMP14+EGFR
ALBUMIN: 4.4 g/dL (ref 3.5–5.5)
ALT: 12 IU/L (ref 0–44)
AST: 16 IU/L (ref 0–40)
Albumin/Globulin Ratio: 1.5 (ref 1.2–2.2)
Alkaline Phosphatase: 66 IU/L (ref 39–117)
BILIRUBIN TOTAL: 0.5 mg/dL (ref 0.0–1.2)
BUN / CREAT RATIO: 14 (ref 9–20)
BUN: 13 mg/dL (ref 6–24)
CHLORIDE: 106 mmol/L (ref 96–106)
CO2: 22 mmol/L (ref 20–29)
CREATININE: 0.92 mg/dL (ref 0.76–1.27)
Calcium: 9.5 mg/dL (ref 8.7–10.2)
GFR calc non Af Amer: 95 mL/min/{1.73_m2} (ref >59)
GFR, EST AFRICAN AMERICAN: 109 mL/min/{1.73_m2} (ref >59)
Globulin, Total: 3 g/dL (ref 1.5–4.5)
Glucose: 80 mg/dL (ref 65–99)
Potassium: 4.3 mmol/L (ref 3.5–5.2)
Sodium: 143 mmol/L (ref 134–144)
TOTAL PROTEIN: 7.4 g/dL (ref 6.0–8.5)

## 2018-05-26 LAB — URINALYSIS, DIPSTICK ONLY
BILIRUBIN UA: NEGATIVE
Glucose, UA: NEGATIVE
Ketones, UA: NEGATIVE
Leukocytes, UA: NEGATIVE
Nitrite, UA: NEGATIVE
PROTEIN UA: NEGATIVE
RBC, UA: NEGATIVE
Urobilinogen, Ur: 0.2 mg/dL (ref 0.2–1.0)
pH, UA: 5.5 (ref 5.0–7.5)

## 2018-05-26 LAB — MICROALBUMIN / CREATININE URINE RATIO
CREATININE, UR: 176.7 mg/dL
MICROALB/CREAT RATIO: 2.7 mg/g{creat} (ref 0.0–30.0)
Microalbumin, Urine: 4.8 ug/mL

## 2018-06-02 ENCOUNTER — Encounter: Payer: Self-pay | Admitting: *Deleted

## 2018-06-24 DIAGNOSIS — Z1211 Encounter for screening for malignant neoplasm of colon: Secondary | ICD-10-CM | POA: Diagnosis not present

## 2018-06-26 LAB — COLOGUARD: Cologuard: POSITIVE

## 2018-07-02 ENCOUNTER — Other Ambulatory Visit: Payer: Self-pay | Admitting: *Deleted

## 2018-07-02 ENCOUNTER — Encounter: Payer: Self-pay | Admitting: *Deleted

## 2018-07-02 ENCOUNTER — Telehealth: Payer: Self-pay | Admitting: *Deleted

## 2018-07-02 DIAGNOSIS — R195 Other fecal abnormalities: Secondary | ICD-10-CM

## 2018-07-02 NOTE — Telephone Encounter (Signed)
Patient was advised Positive Cologuard  Referral put in for GI  Patient advised

## 2018-07-04 ENCOUNTER — Encounter: Payer: Self-pay | Admitting: Gastroenterology

## 2018-07-05 LAB — COLOGUARD: Cologuard: POSITIVE

## 2018-08-02 ENCOUNTER — Ambulatory Visit (AMBULATORY_SURGERY_CENTER): Payer: Self-pay

## 2018-08-02 VITALS — Ht 67.0 in | Wt 159.2 lb

## 2018-08-02 DIAGNOSIS — Z1211 Encounter for screening for malignant neoplasm of colon: Secondary | ICD-10-CM

## 2018-08-02 NOTE — Progress Notes (Signed)
Denies allergies to eggs or soy products. Denies complication of anesthesia or sedation. Denies use of weight loss medication. Denies use of O2.   Emmi instructions declined.  

## 2018-08-06 ENCOUNTER — Encounter: Payer: Self-pay | Admitting: Gastroenterology

## 2018-08-14 ENCOUNTER — Ambulatory Visit (AMBULATORY_SURGERY_CENTER): Payer: BLUE CROSS/BLUE SHIELD | Admitting: Gastroenterology

## 2018-08-14 ENCOUNTER — Encounter: Payer: Self-pay | Admitting: Gastroenterology

## 2018-08-14 VITALS — BP 108/73 | HR 68 | Temp 97.3°F | Resp 10 | Ht 67.0 in | Wt 159.0 lb

## 2018-08-14 DIAGNOSIS — D125 Benign neoplasm of sigmoid colon: Secondary | ICD-10-CM | POA: Diagnosis not present

## 2018-08-14 DIAGNOSIS — D129 Benign neoplasm of anus and anal canal: Secondary | ICD-10-CM

## 2018-08-14 DIAGNOSIS — R195 Other fecal abnormalities: Secondary | ICD-10-CM

## 2018-08-14 DIAGNOSIS — D124 Benign neoplasm of descending colon: Secondary | ICD-10-CM

## 2018-08-14 DIAGNOSIS — D128 Benign neoplasm of rectum: Secondary | ICD-10-CM

## 2018-08-14 DIAGNOSIS — K635 Polyp of colon: Secondary | ICD-10-CM

## 2018-08-14 MED ORDER — SODIUM CHLORIDE 0.9 % IV SOLN: 500.0000 mL | Freq: Once | INTRAVENOUS | Status: DC

## 2018-08-14 NOTE — Progress Notes (Signed)
Pt's states no medical or surgical changes since previsit or office visit. 

## 2018-08-14 NOTE — Progress Notes (Signed)
Called to room to assist during endoscopic procedure.  Patient ID and intended procedure confirmed with present staff. Received instructions for my participation in the procedure from the performing physician.  

## 2018-08-14 NOTE — Progress Notes (Signed)
Flexible sigmoidoscopy not to be scheduled until pathology reviewed by MD.

## 2018-08-14 NOTE — Progress Notes (Signed)
PT taken to PACU. Monitors in place. VSS. Report given to RN. 

## 2018-08-14 NOTE — Op Note (Addendum)
[facility] Patient Name: Jose Kelley Procedure Date: 08/14/2018 8:02 AM MRN: [id number] Endoscopist: Thornton Park MD, MD Age: 52 Referring MD:  Date of Birth: 1966/08/23 Gender: Male Account #: [id number] Procedure:                Colonoscopy Indications:              Screening for colorectal malignant neoplasm. This                            is the patient's first colonoscopy, Incidental -                            Positive Cologuard test. There is no known family                            history of colon cancer or polyps. No baseline GI                            symptoms. Medicines:                See the Anesthesia note for documentation of the                            administered medications Procedure:                Pre-Anesthesia Assessment:                           - Prior to the procedure, a History and Physical                            was performed, and patient medications and                            allergies were reviewed. The patient's tolerance of                            previous anesthesia was also reviewed. The risks                            and benefits of the procedure and the sedation                            options and risks were discussed with the patient.                            All questions were answered, and informed consent                            was obtained. Prior Anticoagulants: The patient has                            taken no previous anticoagulant or antiplatelet  agents. ASA Grade Assessment: II - A patient with                            mild systemic disease. After reviewing the risks                            and benefits, the patient was deemed in                            satisfactory condition to undergo the procedure.                           After obtaining informed consent, the colonoscope                            was passed under direct vision. Throughout the                          procedure, the patient's blood pressure, pulse, and                            oxygen saturations were monitored continuously. The                            Colonoscope was introduced through the anus and                            advanced to the the terminal ileum, with                            identification of the appendiceal orifice and IC                            valve. The colonoscopy was performed without                            difficulty. The patient tolerated the procedure                            well. The quality of the bowel preparation was good. Scope In: 8:09:24 AM Scope Out: 8:34:59 AM Scope Withdrawal Time: 0 hours 23 minutes 31 seconds  Total Procedure Duration: 0 hours 25 minutes 35 seconds  Findings:                 The perianal and digital rectal examinations were                            normal.                           Multiple small and large-mouthed diverticula were                            found in the sigmoid colon and descending colon.  A 6 mm polyp was found in the descending colon. The                            polyp was sessile. The polyp was removed with a                            cold snare. Resection and retrieval were complete.                           A 3 mm polyp was found in the sigmoid colon. The                            polyp was sessile. The polyp was removed with a                            cold snare. Resection and retrieval were complete.                           A 15 mm polyp was found in the rectum 12 cm from                            the anal verge. The polyp was carpet-like. Area was                            unsuccessfully injected with 4 mL saline for a lift                            polypectomy. Because of the unsuccessful lift, a                            snare polypectomy was not successfully performed.                            Multiple biopsies were taken with  a cold forceps                            for histology. It is unclear if the polyp was                            completely removed due to artifact related to the                            attempted polypectomy.                           The distal ileum contained a few diverticula.                           The exam was otherwise without abnormality on                            direct and retroflexion views. Complications:  No immediate complications. Estimated Blood Loss:     Estimated blood loss: none. Impression:               - Diverticulosis in the sigmoid colon and in the                            descending colon.                           - One 6 mm polyp in the descending colon, removed                            with a cold snare. Resected and retrieved.                           - One 3 mm polyp in the sigmoid colon, removed with                            a cold snare. Resected and retrieved.                           - One 15 mm polyp in the rectum. Treatment not                            successful. Biopsied.                           - Ileal diverticula.                           - The examination was otherwise normal on direct                            and retroflexion views. Recommendation:           - Discharge patient to home.                           - Resume previous diet today.                           - Continue present medications.                           - Await pathology results.                           - Flexible sigmoidoscopy in 6 weeks however the                            date will be confirmed after reviewing the                            pathology results. Thornton Park MD, MD 08/14/2018 8:44:41 AM This report has been signed electronically.

## 2018-08-14 NOTE — Patient Instructions (Signed)
Handouts given on polyps. You had 3 polyps removed today. Handouts given on diverticulosis. Flexible sigmoidoscopy in 6 weeks but will not be scheduled until pathology results reviewed by MD.   YOU HAD AN ENDOSCOPIC PROCEDURE TODAY AT [facility]:   Refer to the procedure report that was given to you for any specific questions about what was found during the examination.  If the procedure report does not answer your questions, please call your gastroenterologist to clarify.  If you requested that your care partner not be given the details of your procedure findings, then the procedure report has been included in a sealed envelope for you to review at your convenience later.  YOU SHOULD EXPECT: Some feelings of bloating in the abdomen. Passage of more gas than usual.  Walking can help get rid of the air that was put into your GI tract during the procedure and reduce the bloating. If you had a lower endoscopy (such as a colonoscopy or flexible sigmoidoscopy) you may notice spotting of blood in your stool or on the toilet paper. If you underwent a bowel prep for your procedure, you may not have a normal bowel movement for a few days.  Please Note:  You might notice some irritation and congestion in your nose or some drainage.  This is from the oxygen used during your procedure.  There is no need for concern and it should clear up in a day or so.  SYMPTOMS TO REPORT IMMEDIATELY:   Following lower endoscopy (colonoscopy or flexible sigmoidoscopy):  Excessive amounts of blood in the stool  Significant tenderness or worsening of abdominal pains  Swelling of the abdomen that is new, acute  Fever of 100F or higher   For urgent or emergent issues, a gastroenterologist can be reached at any hour by calling [phone].   DIET:  We do recommend a small meal at first, but then you may proceed to your regular diet.  Drink plenty of fluids but you should avoid alcoholic beverages for  24 hours.  ACTIVITY:  You should plan to take it easy for the rest of today and you should NOT DRIVE or use heavy machinery until tomorrow (because of the sedation medicines used during the test).    FOLLOW UP: Our staff will call the number listed on your records the next business day following your procedure to check on you and address any questions or concerns that you may have regarding the information given to you following your procedure. If we do not reach you, we will leave a message.  However, if you are feeling well and you are not experiencing any problems, there is no need to return our call.  We will assume that you have returned to your regular daily activities without incident.  If any biopsies were taken you will be contacted by phone or by letter within the next 1-3 weeks.  Please call us at [phone] if you have not heard about the biopsies in 3 weeks.    SIGNATURES/CONFIDENTIALITY: You and/or your care partner have signed paperwork which will be entered into your electronic medical record.  These signatures attest to the fact that that the information above on your After Visit Summary has been reviewed and is understood.  Full responsibility of the confidentiality of this discharge information lies with you and/or your care-partner.

## 2018-08-15 ENCOUNTER — Telehealth: Payer: Self-pay

## 2018-08-15 NOTE — Telephone Encounter (Signed)
  Follow up Call-  Call back number 08/14/2018  Post procedure Call Back phone  # [phone]  Permission to leave phone message Yes  Some recent data might be hidden     Patient questions:  Do you have a fever, pain , or abdominal swelling? No. Pain Score  0 *  Have you tolerated food without any problems? Yes.    Have you been able to return to your normal activities? Yes.    Do you have any questions about your discharge instructions: Diet   No. Medications  No. Follow up visit  No.  Do you have questions or concerns about your Care? No.  Actions: * If pain score is 4 or above: No action needed, pain <4.

## 2018-10-05 ENCOUNTER — Other Ambulatory Visit: Payer: Self-pay

## 2018-10-05 ENCOUNTER — Ambulatory Visit (AMBULATORY_SURGERY_CENTER): Payer: Self-pay

## 2018-10-05 VITALS — Ht 67.0 in | Wt 163.2 lb

## 2018-10-05 DIAGNOSIS — K635 Polyp of colon: Secondary | ICD-10-CM

## 2018-10-05 DIAGNOSIS — D125 Benign neoplasm of sigmoid colon: Secondary | ICD-10-CM

## 2018-10-05 DIAGNOSIS — Z86010 Personal history of colonic polyps: Secondary | ICD-10-CM

## 2018-10-05 NOTE — Progress Notes (Signed)
No egg or soy allergy known to patient  No issues with past sedation with any surgeries  or procedures, no intubation problems  No diet pills per patient No home 02 use per patient  No blood thinners per patient  Pt denies issues with constipation  No A fib or A flutter  EMMI video sent to pt's e mail , pt declined    

## 2018-10-09 ENCOUNTER — Encounter: Payer: Self-pay | Admitting: Gastroenterology

## 2018-10-19 ENCOUNTER — Encounter: Payer: Self-pay | Admitting: Gastroenterology

## 2018-10-19 ENCOUNTER — Ambulatory Visit (AMBULATORY_SURGERY_CENTER): Payer: BLUE CROSS/BLUE SHIELD | Admitting: Gastroenterology

## 2018-10-19 VITALS — BP 92/68 | HR 81 | Temp 99.1°F | Resp 13 | Ht 68.31 in | Wt 159.0 lb

## 2018-10-19 DIAGNOSIS — D120 Benign neoplasm of cecum: Secondary | ICD-10-CM

## 2018-10-19 DIAGNOSIS — D125 Benign neoplasm of sigmoid colon: Secondary | ICD-10-CM

## 2018-10-19 DIAGNOSIS — Z86010 Personal history of colonic polyps: Secondary | ICD-10-CM | POA: Diagnosis not present

## 2018-10-19 DIAGNOSIS — D128 Benign neoplasm of rectum: Secondary | ICD-10-CM | POA: Diagnosis not present

## 2018-10-19 DIAGNOSIS — K635 Polyp of colon: Secondary | ICD-10-CM

## 2018-10-19 MED ORDER — SODIUM CHLORIDE 0.9 % IV SOLN: 500.0000 mL | INTRAVENOUS | Status: DC

## 2018-10-19 NOTE — Progress Notes (Signed)
Pt's states no medical or surgical changes since previsit or office visit. 

## 2018-10-19 NOTE — Op Note (Addendum)
[facility] Patient Name: Jose Kelley Procedure Date: 10/19/2018 7:52 AM MRN: [id number] Endoscopist: Thornton Park MD, MD Age: 53 Referring MD:  Date of Birth: Feb 01, 1966 Gender: Male Account #: [id number] Procedure:                Flexible Sigmoidoscopy Indications:              High risk colon cancer surveillance: Personal                            history of adenoma (10 mm or greater in size) with                            attempted resected 08/15/15 in a piecemeal fashion.                            Pathology revealed tubular adenoma. Surveillance                            recommended in 6-8 weeks to reassess the                            polypectomy site given the piecemeal resection. Medicines:                See the Anesthesia note for documentation of the                            administered medications Procedure:                Pre-Anesthesia Assessment:                           - Prior to the procedure, a History and Physical                            was performed, and patient medications and                            allergies were reviewed. The patient's tolerance of                            previous anesthesia was also reviewed. The risks                            and benefits of the procedure and the sedation                            options and risks were discussed with the patient.                            All questions were answered, and informed consent                            was obtained. Prior Anticoagulants: The patient has  taken no previous anticoagulant or antiplatelet                            agents. ASA Grade Assessment: II - A patient with                            mild systemic disease. After reviewing the risks                            and benefits, the patient was deemed in                            satisfactory condition to undergo the procedure.                           After  obtaining informed consent, the scope was                            passed under direct vision. The Model PCF-H190DL                            [id number]) scope was introduced through the anus                            and advanced to the the left transverse colon. The                            flexible sigmoidoscopy was accomplished without                            difficulty. The patient tolerated the procedure                            well. The quality of the bowel preparation was good. Scope In: Scope Out: Findings:                 The perianal and digital rectal examinations were                            normal.                           A 5 mm polyp was found in the sigmoid colon. The                            polyp was flat and appeared hyperplastic. The polyp                            was partially removed with a cold snare and then                            completely resected with cold froceps. Resection  and retrieval were complete.                           A 8 mm polyp was found in the rectum located 12 cm                            from the anal verge. The polyp was flat. Area was                            unsuccessfully injected with 4 mL saline for lesion                            assessment, but the lesion could not be lifted                            adequately. The polyp would not lift. I attempted                            to removed The polyp with a cold snare. There                            appeared to still be some lateral edges which were                            resected with cold forceps. Resection and retrieval                            appeared to be complete. However, deal to the                            artifact and the fact that the polyp did not lift,                            this will need to be closely followed.                           The exam was otherwise without abnormality. Complications:             No immediate complications. Estimated blood loss:                            Minimal. Estimated Blood Loss:     Estimated blood loss was minimal. Impression:               - One 5 mm polyp in the sigmoid colon, removed with                            a cold snare. Resected and retrieved.                           - One 8 mm polyp in the rectum, removed with a cold  snare. Resected and retrieved. Treatment not                            successful.                           - The examination was otherwise normal. Recommendation:           - Resume regular diet.                           - Await pathology results.                           - Repeat flexible sigmoidoscopy for surveillance                            based on pathology results. Thornton Park MD, MD 10/19/2018 8:43:45 AM This report has been signed electronically.

## 2018-10-19 NOTE — Patient Instructions (Signed)
2 polyps removed today.  You will probably see some bleeding.  Excessive bleeding however should be reported immediately. Treatment unsuccessful.   YOU HAD AN ENDOSCOPIC PROCEDURE TODAY AT [facility]:   Refer to the procedure report that was given to you for any specific questions about what was found during the examination.  If the procedure report does not answer your questions, please call your gastroenterologist to clarify.  If you requested that your care partner not be given the details of your procedure findings, then the procedure report has been included in a sealed envelope for you to review at your convenience later.  YOU SHOULD EXPECT: Some feelings of bloating in the abdomen. Passage of more gas than usual.  Walking can help get rid of the air that was put into your GI tract during the procedure and reduce the bloating. If you had a lower endoscopy (such as a colonoscopy or flexible sigmoidoscopy) you may notice spotting of blood in your stool or on the toilet paper. If you underwent a bowel prep for your procedure, you may not have a normal bowel movement for a few days.  Please Note:  You might notice some irritation and congestion in your nose or some drainage.  This is from the oxygen used during your procedure.  There is no need for concern and it should clear up in a day or so.  SYMPTOMS TO REPORT IMMEDIATELY:   Following lower endoscopy (colonoscopy or flexible sigmoidoscopy):  Excessive amounts of blood in the stool  Significant tenderness or worsening of abdominal pains  Swelling of the abdomen that is new, acute  Fever of 100F or higher   For urgent or emergent issues, a gastroenterologist can be reached at any hour by calling [phone].   DIET:  We do recommend a small meal at first, but then you may proceed to your regular diet.  Drink plenty of fluids but you should avoid alcoholic beverages for 24 hours.  ACTIVITY:  You should plan to take  it easy for the rest of today and you should NOT DRIVE or use heavy machinery until tomorrow (because of the sedation medicines used during the test).    FOLLOW UP: Our staff will call the number listed on your records the next business day following your procedure to check on you and address any questions or concerns that you may have regarding the information given to you following your procedure. If we do not reach you, we will leave a message.  However, if you are feeling well and you are not experiencing any problems, there is no need to return our call.  We will assume that you have returned to your regular daily activities without incident.  If any biopsies were taken you will be contacted by phone or by letter within the next 1-3 weeks.  Please call us at [phone] if you have not heard about the biopsies in 3 weeks.    SIGNATURES/CONFIDENTIALITY: You and/or your care partner have signed paperwork which will be entered into your electronic medical record.  These signatures attest to the fact that that the information above on your After Visit Summary has been reviewed and is understood.  Full responsibility of the confidentiality of this discharge information lies with you and/or your care-partner.

## 2018-10-19 NOTE — Progress Notes (Signed)
Called to room to assist during endoscopic procedure.  Patient ID and intended procedure confirmed with present staff. Received instructions for my participation in the procedure from the performing physician.  

## 2018-10-19 NOTE — Progress Notes (Signed)
To PACU, VSS. Report to [email or web address] 

## 2018-10-22 ENCOUNTER — Telehealth: Payer: Self-pay | Admitting: *Deleted

## 2018-10-22 NOTE — Telephone Encounter (Signed)
  Follow up Call-  Call back number 10/19/2018 08/14/2018  Post procedure Call Back phone  # [phone]  Permission to leave phone message Yes Yes  Some recent data might be hidden     Patient questions:  Do you have a fever, pain , or abdominal swelling? No. Pain Score  0 *  Have you tolerated food without any problems? Yes.    Have you been able to return to your normal activities? Yes.    Do you have any questions about your discharge instructions: Diet   No. Medications  No. Follow up visit  No.  Do you have questions or concerns about your Care? No.  Actions: * If pain score is 4 or above: No action needed, pain <4.

## 2019-01-25 ENCOUNTER — Encounter: Payer: Self-pay | Admitting: Gastroenterology

## 2019-06-24 ENCOUNTER — Telehealth (INDEPENDENT_AMBULATORY_CARE_PROVIDER_SITE_OTHER): Payer: BC Managed Care – PPO | Admitting: Family Medicine

## 2019-06-24 ENCOUNTER — Other Ambulatory Visit: Payer: Self-pay

## 2019-06-24 DIAGNOSIS — I10 Essential (primary) hypertension: Secondary | ICD-10-CM

## 2019-06-24 DIAGNOSIS — Z20822 Contact with and (suspected) exposure to covid-19: Secondary | ICD-10-CM

## 2019-06-24 DIAGNOSIS — Z20828 Contact with and (suspected) exposure to other viral communicable diseases: Secondary | ICD-10-CM

## 2019-06-24 DIAGNOSIS — E1165 Type 2 diabetes mellitus with hyperglycemia: Secondary | ICD-10-CM | POA: Diagnosis not present

## 2019-06-24 DIAGNOSIS — E7800 Pure hypercholesterolemia, unspecified: Secondary | ICD-10-CM | POA: Diagnosis not present

## 2019-06-24 MED ORDER — ATORVASTATIN CALCIUM 40 MG PO TABS: 40.0000 mg | ORAL_TABLET | Freq: Every day | ORAL | 1 refills | Status: DC

## 2019-06-24 MED ORDER — LISINOPRIL-HYDROCHLOROTHIAZIDE 20-25 MG PO TABS: 1.0000 | ORAL_TABLET | Freq: Every day | ORAL | 1 refills | Status: DC

## 2019-06-24 MED ORDER — METFORMIN HCL 500 MG PO TABS: 500.0000 mg | ORAL_TABLET | Freq: Every day | ORAL | 1 refills | Status: DC

## 2019-06-24 NOTE — Progress Notes (Signed)
CC- Would like to get tested for covid/ med refills- [phone].- Just need to be tested for covid wife was tested last week but her test came back neg today. and also need a refill

## 2019-06-24 NOTE — Progress Notes (Signed)
Virtual Visit via Telephone Note  I connected with Jose Kelley on 06/24/19 at 3:43 PM by telephone and verified that I am speaking with the correct person using two identifiers.   I discussed the limitations, risks, security and privacy concerns of performing an evaluation and management service by telephone and the availability of in person appointments. I also discussed with the patient that there may be a patient responsible charge related to this service. The patient expressed understanding and agreed to proceed, consent obtained  Chief complaint:  Med refills and COVID-19 testing.  History of Present Illness: Jose Kelley is a 53 y.o. male New patient to me.  Previously followed by Dr. Brigitte Pulse.   Diabetes: Well controlled by last A1c in 2019. Takes metformin 500 mg QD. Ran out yesterday.  He is on a statin Lipitor 40 mg daily as well as ACE inhibitor - lisinoprilHCTZ  Microalbumin: Normal ratio September 2019 Optho, foot exam, pneumovax: Pneumovax September 2019, foot exam September 2019. No new side effects with metformin. Home readings 116-121. No symptomatic lows.   Lab Results  Component Value Date   HGBA1C 6.2 (A) 05/25/2018   HGBA1C 6.5 04/15/2017   HGBA1C 7.3 01/14/2017   Lab Results  Component Value Date   LDLCALC 133 (H) 05/25/2018   CREATININE 0.92 05/25/2018    Hyperlipidemia:  Lab Results  Component Value Date   CHOL 192 05/25/2018   HDL 43 05/25/2018   LDLCALC 133 (H) 05/25/2018   TRIG 78 05/25/2018   CHOLHDL 4.5 05/25/2018   Lab Results  Component Value Date   ALT 12 05/25/2018   AST 16 05/25/2018   ALKPHOS 66 05/25/2018   BILITOT 0.5 05/25/2018  Lipitor 40 mg daily.  No new side effects, no missed doses.   Hypertension: BP Readings from Last 3 Encounters:  10/19/18 92/68  08/14/18 108/73  05/25/18 (!) 142/82   Lab Results  Component Value Date   CREATININE 0.92 05/25/2018  Lisinopril HCTZ 20/25 mg daily.  No new side effects.   Occasional [employer] readings - 130/86 recently.  Constitutional: Negative for fatigue and unexpected weight change.  Eyes: Negative for visual disturbance.  Respiratory: Negative for cough, chest tightness and shortness of breath.   Cardiovascular: Negative for chest pain, palpitations and leg swelling.  Gastrointestinal: Negative for abdominal pain and blood in stool.  Neurological: Negative for dizziness, light-headedness and headaches.    COVID-19 testing: Wife's sister was positive for Covid - found out 4 days ago. He has not been around her, but wife was around her wearing a mask. Wife's test was negative. He needs negative test for work.  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  Needs repeat test.  No fever, cough, HA, bodyache/HA, change in taste/smell. measuring nl temps at home.     Patient Active Problem List   Diagnosis Date Noted  . Type 2 diabetes mellitus with hyperglycemia, without long-term current use of insulin ([place]) 01/14/2017  . Hyperlipidemia 01/14/2017  . Tobacco use disorder 01/14/2017  . Essential hypertension 01/14/2017   Past Medical History:  Diagnosis Date  . Diabetes mellitus without complication ([place])   . Hypertension    Past Surgical History:  Procedure Laterality Date  . COLONOSCOPY    . POLYPECTOMY    . SHOULDER SURGERY Right    No Known Allergies Prior to Admission medications   Medication Sig Start Date End Date Taking? Authorizing Provider  atorvastatin (LIPITOR) 40 MG tablet Take 1 tablet (40  mg total) by mouth daily. 05/25/18  Yes Wiseman, Brittany D, PA-C  blood glucose meter kit and supplies KIT Dispense based on patient and insurance preference. Use up to four times daily as directed. E11.65 01/14/17  Yes Shaw, Eva N, MD  lisinopril-hydrochlorothiazide (PRINZIDE,ZESTORETIC) 20-25 MG tablet Take 1 tablet by mouth daily. 05/25/18  Yes Wiseman, Brittany D, PA-C  metFORMIN (GLUCOPHAGE) 500 MG tablet Take 1 tablet (500  mg total) by mouth daily with breakfast. Take second tab before dinner if needed as directed by physician 05/25/18  Yes Wiseman, Brittany D, PA-C   Social History   Socioeconomic History  . Marital status: Married    Spouse name: Not on file  . Number of children: Not on file  . Years of education: Not on file  . Highest education level: Not on file  Occupational History  . Not on file  Social Needs  . Financial resource strain: Not on file  . Food insecurity    Worry: Not on file    Inability: Not on file  . Transportation needs    Medical: Not on file    Non-medical: Not on file  Tobacco Use  . Smoking status: Current Every Day Smoker    Packs/day: 0.25    Years: 10.00    Pack years: 2.50  . Smokeless tobacco: Never Used  Substance and Sexual Activity  . Alcohol use: Yes    Comment: Occasional beer  . Drug use: No  . Sexual activity: Not on file  Lifestyle  . Physical activity    Days per week: Not on file    Minutes per session: Not on file  . Stress: Not on file  Relationships  . Social connections    Talks on phone: Not on file    Gets together: Not on file    Attends religious service: Not on file    Active member of club or organization: Not on file    Attends meetings of clubs or organizations: Not on file    Relationship status: Not on file  . Intimate partner violence    Fear of current or ex partner: Not on file    Emotionally abused: Not on file    Physically abused: Not on file    Forced sexual activity: Not on file  Other Topics Concern  . Not on file  Social History Narrative  . Not on file     Observations/Objective: No distress, appropriate responses, all questions answered.  No vitals obtained at home.  Blood pressure recently as above.  Blood sugar recently as above in HPI.   Assessment and Plan: Type 2 diabetes mellitus with hyperglycemia, without long-term current use of insulin ([facility]) - Plan: metFORMIN (GLUCOPHAGE) 500 MG tablet,  Comprehensive metabolic panel, Hemoglobin A1c  -Tolerating meds, home readings controlled.  Plans on fasting labs tomorrow with A1c, lipids.  Continue same regimen.  Pure hypercholesterolemia - Plan: atorvastatin (LIPITOR) 40 MG tablet, Comprehensive metabolic panel, Lipid panel  -  Stable, tolerating current regimen. Medications refilled. Labs pending as above.   Essential hypertension - Plan: lisinopril-hydrochlorothiazide (ZESTORETIC) 20-25 MG tablet  -  Stable, tolerating current regimen. Medications refilled. Labs pending as above.   Exposure to COVID-19 virus - Plan: Novel Coronavirus, NAA ([facility])  -Indirect exposure, asymptomatic.  Screening testing ordered, but scheduled visit if any symptoms.  Follow Up Instructions:  3 months in person.    I discussed the assessment and treatment plan with the patient. The patient was provided an   opportunity to ask questions and all were answered. The patient agreed with the plan and demonstrated an understanding of the instructions.   The patient was advised to call back or seek an in-person evaluation if the symptoms worsen or if the condition fails to improve as anticipated.  I provided 15 minutes of non-face-to-face time during this encounter.  Signed,   Merri Ray, MD [facility].  06/24/19

## 2019-06-24 NOTE — Patient Instructions (Signed)
Fasting labs at our office tomorrow morning, then COVID-19 screening test at the drive up testing center, [facility].  You should be contacted or be able to view your results within 2 to 3 days.  No medicine changes for now. Other lab results within 2 weeks but let me know if there are questions.  Follow-up in office in 3 months.  Take care.

## 2019-06-25 ENCOUNTER — Ambulatory Visit: Payer: BC Managed Care – PPO

## 2019-06-25 ENCOUNTER — Other Ambulatory Visit: Payer: Self-pay

## 2019-06-25 DIAGNOSIS — E1165 Type 2 diabetes mellitus with hyperglycemia: Secondary | ICD-10-CM | POA: Diagnosis not present

## 2019-06-25 DIAGNOSIS — E7800 Pure hypercholesterolemia, unspecified: Secondary | ICD-10-CM

## 2019-06-25 DIAGNOSIS — Z20828 Contact with and (suspected) exposure to other viral communicable diseases: Secondary | ICD-10-CM | POA: Diagnosis not present

## 2019-06-25 DIAGNOSIS — Z20822 Contact with and (suspected) exposure to covid-19: Secondary | ICD-10-CM

## 2019-06-26 LAB — COMPREHENSIVE METABOLIC PANEL
ALT: 14 IU/L (ref 0–44)
AST: 19 IU/L (ref 0–40)
Albumin/Globulin Ratio: 1.5 (ref 1.2–2.2)
Albumin: 4.4 g/dL (ref 3.8–4.9)
Alkaline Phosphatase: 93 IU/L (ref 39–117)
BUN/Creatinine Ratio: 7 — ABNORMAL LOW (ref 9–20)
BUN: 8 mg/dL (ref 6–24)
Bilirubin Total: 0.3 mg/dL (ref 0.0–1.2)
CO2: 22 mmol/L (ref 20–29)
Calcium: 10.5 mg/dL — ABNORMAL HIGH (ref 8.7–10.2)
Chloride: 103 mmol/L (ref 96–106)
Creatinine, Ser: 1.08 mg/dL (ref 0.76–1.27)
GFR calc Af Amer: 89 mL/min/{1.73_m2} (ref >59)
GFR calc non Af Amer: 77 mL/min/{1.73_m2} (ref >59)
Globulin, Total: 2.9 g/dL (ref 1.5–4.5)
Glucose: 132 mg/dL — ABNORMAL HIGH (ref 65–99)
Potassium: 4 mmol/L (ref 3.5–5.2)
Sodium: 140 mmol/L (ref 134–144)
Total Protein: 7.3 g/dL (ref 6.0–8.5)

## 2019-06-26 LAB — LIPID PANEL
Chol/HDL Ratio: 4.3 ratio (ref 0.0–5.0)
Cholesterol, Total: 170 mg/dL (ref 100–199)
HDL: 40 mg/dL (ref >39)
LDL Chol Calc (NIH): 97 mg/dL (ref 0–99)
Triglycerides: 191 mg/dL — ABNORMAL HIGH (ref 0–149)
VLDL Cholesterol Cal: 33 mg/dL (ref 5–40)

## 2019-06-26 LAB — HEMOGLOBIN A1C
Est. average glucose Bld gHb Est-mCnc: 140 mg/dL
Hgb A1c MFr Bld: 6.5 % — ABNORMAL HIGH (ref 4.8–5.6)

## 2019-06-27 LAB — NOVEL CORONAVIRUS, NAA: SARS-CoV-2, NAA: NOT DETECTED

## 2019-10-21 ENCOUNTER — Other Ambulatory Visit: Payer: Self-pay | Admitting: Emergency Medicine

## 2019-10-21 ENCOUNTER — Telehealth: Payer: Self-pay | Admitting: Family Medicine

## 2019-10-21 DIAGNOSIS — E1165 Type 2 diabetes mellitus with hyperglycemia: Secondary | ICD-10-CM

## 2019-10-21 MED ORDER — GLUCOSE BLOOD VI STRP: ORAL_STRIP | 12 refills | Status: AC

## 2019-10-21 NOTE — Telephone Encounter (Signed)
Test strips has been sent into the pharmacy

## 2019-10-21 NOTE — Telephone Encounter (Signed)
Medication Refill - Medication: One Touch Ultimate Two test strips  Has the patient contacted their pharmacy? No - no RX on file for him (Agent: If no, request that the patient contact the pharmacy for the refill.) (Agent: If yes, when and what did the pharmacy advise?)  Preferred Pharmacy (with phone number or street name):  [facility] [place] [facility] [place] Phone:  [phone]  Fax:  [phone]     Agent: Please be advised that RX refills may take up to 3 business days. We ask that you follow-up with your pharmacy.

## 2019-10-24 DIAGNOSIS — Z20822 Contact with and (suspected) exposure to covid-19: Secondary | ICD-10-CM | POA: Diagnosis not present

## 2020-06-19 DIAGNOSIS — Z20822 Contact with and (suspected) exposure to covid-19: Secondary | ICD-10-CM | POA: Diagnosis not present

## 2020-10-15 ENCOUNTER — Other Ambulatory Visit: Payer: Self-pay

## 2020-10-15 ENCOUNTER — Encounter: Payer: Self-pay | Admitting: Family Medicine

## 2020-10-15 ENCOUNTER — Ambulatory Visit: Payer: BC Managed Care – PPO | Admitting: Family Medicine

## 2020-10-15 VITALS — BP 138/90 | HR 77 | Temp 98.0°F | Ht 68.0 in | Wt 161.0 lb

## 2020-10-15 DIAGNOSIS — Z23 Encounter for immunization: Secondary | ICD-10-CM

## 2020-10-15 DIAGNOSIS — Z1159 Encounter for screening for other viral diseases: Secondary | ICD-10-CM

## 2020-10-15 DIAGNOSIS — K219 Gastro-esophageal reflux disease without esophagitis: Secondary | ICD-10-CM

## 2020-10-15 DIAGNOSIS — I10 Essential (primary) hypertension: Secondary | ICD-10-CM | POA: Diagnosis not present

## 2020-10-15 DIAGNOSIS — Z114 Encounter for screening for human immunodeficiency virus [HIV]: Secondary | ICD-10-CM | POA: Diagnosis not present

## 2020-10-15 DIAGNOSIS — E7800 Pure hypercholesterolemia, unspecified: Secondary | ICD-10-CM

## 2020-10-15 DIAGNOSIS — M62830 Muscle spasm of back: Secondary | ICD-10-CM

## 2020-10-15 DIAGNOSIS — Z20822 Contact with and (suspected) exposure to covid-19: Secondary | ICD-10-CM | POA: Diagnosis not present

## 2020-10-15 DIAGNOSIS — E1165 Type 2 diabetes mellitus with hyperglycemia: Secondary | ICD-10-CM | POA: Diagnosis not present

## 2020-10-15 MED ORDER — ATORVASTATIN CALCIUM 40 MG PO TABS: 40.0000 mg | ORAL_TABLET | Freq: Every day | ORAL | 1 refills | Status: DC

## 2020-10-15 MED ORDER — METFORMIN HCL 500 MG PO TABS: 500.0000 mg | ORAL_TABLET | Freq: Every day | ORAL | 1 refills | Status: DC

## 2020-10-15 MED ORDER — PANTOPRAZOLE SODIUM 40 MG PO TBEC: 40.0000 mg | DELAYED_RELEASE_TABLET | Freq: Every day | ORAL | 3 refills | Status: DC

## 2020-10-15 MED ORDER — LISINOPRIL-HYDROCHLOROTHIAZIDE 20-25 MG PO TABS: 1.0000 | ORAL_TABLET | Freq: Every day | ORAL | 1 refills | Status: DC

## 2020-10-15 MED ORDER — CYCLOBENZAPRINE HCL 10 MG PO TABS: 10.0000 mg | ORAL_TABLET | Freq: Three times a day (TID) | ORAL | 0 refills | Status: DC | PRN

## 2020-10-15 NOTE — Patient Instructions (Addendum)
Food Choices for Gastroesophageal Reflux Disease, Adult When you have gastroesophageal reflux disease (GERD), the foods you eat and your eating habits are very important. Choosing the right foods can help ease your discomfort. Think about working with a food expert (dietitian) to help you make good choices. What are tips for following this plan? Reading food labels  Look for foods that are low in saturated fat. Foods that may help with your symptoms include: ? Foods that have less than 5% of daily value (DV) of fat. ? Foods that have 0 grams of trans fat. Cooking  Do not fry your food.  Cook your food by baking, steaming, grilling, or broiling. These are all methods that do not need a lot of fat for cooking.  To add flavor, try to use herbs that are low in spice and acidity. Meal planning  Choose healthy foods that are low in fat, such as: ? Fruits and vegetables. ? Whole grains. ? Low-fat dairy products. ? Lean meats, fish, and poultry.  Eat small meals often instead of eating 3 large meals each day. Eat your meals slowly in a place where you are relaxed. Avoid bending over or lying down until 2-3 hours after eating.  Limit high-fat foods such as fatty meats or fried foods.  Limit your intake of fatty foods, such as oils, butter, and shortening.  Avoid the following as told by your doctor: ? Foods that cause symptoms. These may be different for different people. Keep a food diary to keep track of foods that cause symptoms. ? Alcohol. ? Drinking a lot of liquid with meals. ? Eating meals during the 2-3 hours before bed.   Lifestyle  Stay at a healthy weight. Ask your doctor what weight is healthy for you. If you need to lose weight, work with your doctor to do so safely.  Exercise for at least 30 minutes on 5 or more days each week, or as told by your doctor.  Wear loose-fitting clothes.  Do not smoke or use any products that contain nicotine or tobacco. If you need help  quitting, ask your doctor.  Sleep with the head of your bed higher than your feet. Use a wedge under the mattress or blocks under the bed frame to raise the head of the bed.  Chew sugar-free gum after meals. What foods should eat? Eat a healthy, well-balanced diet of fruits, vegetables, whole grains, low-fat dairy products, lean meats, fish, and poultry. Each person is different. Foods that may cause symptoms in one person may not cause any symptoms in another person. Work with your doctor to find foods that are safe for you. The items listed above may not be a complete list of what you can eat and drink. Contact a food expert for more options.   What foods should I avoid? Limiting some of these foods may help in managing the symptoms of GERD. Everyone is different. Talk with a food expert or your doctor to help you find the exact foods to avoid, if any. Fruits Any fruits prepared with added fat. Any fruits that cause symptoms. For some people, this may include citrus fruits, such as oranges, grapefruit, pineapple, and lemons. Vegetables Deep-fried vegetables. French fries. Any vegetables prepared with added fat. Any vegetables that cause symptoms. For some people, this may include tomatoes and tomato products, chili peppers, onions and garlic, and horseradish. Grains Pastries or quick breads with added fat. Meats and other proteins High-fat meats, such as fatty beef or pork,   hot dogs, ribs, ham, sausage, salami, and bacon. Fried meat or protein, including fried fish and fried chicken. Nuts and nut butters, in large amounts. Dairy Whole milk and chocolate milk. Sour cream. Cream. Ice cream. Cream cheese. Milkshakes. Fats and oils Butter. Margarine. Shortening. Ghee. Beverages Coffee and tea, with or without caffeine. Carbonated beverages. Sodas. Energy drinks. Fruit juice made with acidic fruits, such as orange or grapefruit. Tomato juice. Alcoholic drinks. Sweets and desserts Chocolate and  cocoa. Donuts. Seasonings and condiments Pepper. Peppermint and spearmint. Added salt. Any condiments, herbs, or seasonings that cause symptoms. For some people, this may include curry, hot sauce, or vinegar-based salad dressings. The items listed above may not be a complete list of what you should not eat and drink. Contact a food expert for more options. Questions to ask your doctor Diet and lifestyle changes are often the first steps that are taken to manage symptoms of GERD. If diet and lifestyle changes do not help, talk with your doctor about taking medicines. Where to find more information  International Foundation for Gastrointestinal Disorders: aboutgerd.org Summary  When you have GERD, food and lifestyle choices are very important in easing your symptoms.  Eat small meals often instead of 3 large meals a day. Eat your meals slowly and in a place where you are relaxed.  Avoid bending over or lying down until 2-3 hours after eating.  Limit high-fat foods such as fatty meats or fried foods. This information is not intended to replace advice given to you by your health care provider. Make sure you discuss any questions you have with your health care provider. Document Revised: 03/16/2020 Document Reviewed: 03/16/2020 Elsevier Patient Education  [place] or Strain Rehab Ask your health care provider which exercises are safe for you. Do exercises exactly as told by your health care provider and adjust them as directed. It is normal to feel mild stretching, pulling, tightness, or discomfort as you do these exercises. Stop right away if you feel sudden pain or your pain gets worse. Do not begin these exercises until told by your health care provider. Stretching and range-of-motion exercises These exercises warm up your muscles and joints and improve the movement and flexibility of your back. These exercises also help to relieve pain, numbness, and  tingling. Lumbar rotation 1. Lie on your back on a firm surface and bend your knees. 2. Straighten your arms out to your sides so each arm forms a 90-degree angle (right angle) with a side of your body. 3. Slowly move (rotate) both of your knees to one side of your body until you feel a stretch in your lower back (lumbar). Try not to let your shoulders lift off the floor. 4. Hold this position for __________ seconds. 5. Tense your abdominal muscles and slowly move your knees back to the starting position. 6. Repeat this exercise on the other side of your body. Repeat __________ times. Complete this exercise __________ times a day.   Single knee to chest 1. Lie on your back on a firm surface with both legs straight. 2. Bend one of your knees. Use your hands to move your knee up toward your chest until you feel a gentle stretch in your lower back and buttock. ? Hold your leg in this position by holding on to the front of your knee. ? Keep your other leg as straight as possible. 3. Hold this position for __________ seconds. 4. Slowly return to the  starting position. 5. Repeat with your other leg. Repeat __________ times. Complete this exercise __________ times a day.   Prone extension on elbows 1. Lie on your abdomen on a firm surface (prone position). 2. Prop yourself up on your elbows. 3. Use your arms to help lift your chest up until you feel a gentle stretch in your abdomen and your lower back. ? This will place some of your body weight on your elbows. If this is uncomfortable, try stacking pillows under your chest. ? Your hips should stay down, against the surface that you are lying on. Keep your hip and back muscles relaxed. 4. Hold this position for __________ seconds. 5. Slowly relax your upper body and return to the starting position. Repeat __________ times. Complete this exercise __________ times a day.   Strengthening exercises These exercises build strength and endurance in your  back. Endurance is the ability to use your muscles for a long time, even after they get tired. Pelvic tilt This exercise strengthens the muscles that lie deep in the abdomen. 1. Lie on your back on a firm surface. Bend your knees and keep your feet flat on the floor. 2. Tense your abdominal muscles. Tip your pelvis up toward the ceiling and flatten your lower back into the floor. ? To help with this exercise, you may place a small towel under your lower back and try to push your back into the towel. 3. Hold this position for __________ seconds. 4. Let your muscles relax completely before you repeat this exercise. Repeat __________ times. Complete this exercise __________ times a day. Alternating arm and leg raises 1. Get on your hands and knees on a firm surface. If you are on a hard floor, you may want to use padding, such as an exercise mat, to cushion your knees. 2. Line up your arms and legs. Your hands should be directly below your shoulders, and your knees should be directly below your hips. 3. Lift your left leg behind you. At the same time, raise your right arm and straighten it in front of you. ? Do not lift your leg higher than your hip. ? Do not lift your arm higher than your shoulder. ? Keep your abdominal and back muscles tight. ? Keep your hips facing the ground. ? Do not arch your back. ? Keep your balance carefully, and do not hold your breath. 4. Hold this position for __________ seconds. 5. Slowly return to the starting position. 6. Repeat with your right leg and your left arm. Repeat __________ times. Complete this exercise __________ times a day.   Abdominal set with straight leg raise 1. Lie on your back on a firm surface. 2. Bend one of your knees and keep your other leg straight. 3. Tense your abdominal muscles and lift your straight leg up, 4-6 inches (10-15 cm) off the ground. 4. Keep your abdominal muscles tight and hold this position for __________ seconds. ? Do  not hold your breath. ? Do not arch your back. Keep it flat against the ground. 5. Keep your abdominal muscles tense as you slowly lower your leg back to the starting position. 6. Repeat with your other leg. Repeat __________ times. Complete this exercise __________ times a day.   Single leg lower with bent knees 1. Lie on your back on a firm surface. 2. Tense your abdominal muscles and lift your feet off the floor, one foot at a time, so your knees and hips are bent in 90-degree angles (  right angles). ? Your knees should be over your hips and your lower legs should be parallel to the floor. 3. Keeping your abdominal muscles tense and your knee bent, slowly lower one of your legs so your toe touches the ground. 4. Lift your leg back up to return to the starting position. ? Do not hold your breath. ? Do not let your back arch. Keep your back flat against the ground. 5. Repeat with your other leg. Repeat __________ times. Complete this exercise __________ times a day. Posture and body mechanics Good posture and healthy body mechanics can help to relieve stress in your body's tissues and joints. Body mechanics refers to the movements and positions of your body while you do your daily activities. Posture is part of body mechanics. Good posture means:  Your spine is in its natural S-curve position (neutral).  Your shoulders are pulled back slightly.  Your head is not tipped forward. Follow these guidelines to improve your posture and body mechanics in your everyday activities. Standing  When standing, keep your spine neutral and your feet about hip width apart. Keep a slight bend in your knees. Your ears, shoulders, and hips should line up.  When you do a task in which you stand in one place for a long time, place one foot up on a stable object that is 2-4 inches (5-10 cm) high, such as a footstool. This helps keep your spine neutral.   Sitting  When sitting, keep your spine neutral and  keep your feet flat on the floor. Use a footrest, if necessary, and keep your thighs parallel to the floor. Avoid rounding your shoulders, and avoid tilting your head forward.  When working at a desk or a computer, keep your desk at a height where your hands are slightly lower than your elbows. Slide your chair under your desk so you are close enough to maintain good posture.  When working at a computer, place your monitor at a height where you are looking straight ahead and you do not have to tilt your head forward or downward to look at the screen.   Resting  When lying down and resting, avoid positions that are most painful for you.  If you have pain with activities such as sitting, bending, stooping, or squatting, lie in a position in which your body does not bend very much. For example, avoid curling up on your side with your arms and knees near your chest (fetal position).  If you have pain with activities such as standing for a long time or reaching with your arms, lie with your spine in a neutral position and bend your knees slightly. Try the following positions: ? Lying on your side with a pillow between your knees. ? Lying on your back with a pillow under your knees. Lifting  When lifting objects, keep your feet at least shoulder width apart and tighten your abdominal muscles.  Bend your knees and hips and keep your spine neutral. It is important to lift using the strength of your legs, not your back. Do not lock your knees straight out.  Always ask for help to lift heavy or awkward objects.   This information is not intended to replace advice given to you by your health care provider. Make sure you discuss any questions you have with your health care provider. Document Revised: 12/28/2018 Document Reviewed: 09/27/2018 Elsevier Patient Education  2021 [employer].   If you have lab work done today you will be  contacted with your lab results within the next 2 weeks.  If you have  not heard from [place] then please contact us. The fastest way to get your results is to register for My Chart.   IF you received an x-ray today, you will receive an invoice from [facility]. Please contact [facility] at [phone] with questions or concerns regarding your invoice.   IF you received labwork today, you will receive an invoice from [facility]. Please contact [facility] at [phone] with questions or concerns regarding your invoice.   Our billing staff will not be able to assist you with questions regarding bills from these companies.  You will be contacted with the lab results as soon as they are available. The fastest way to get your results is to activate your My Chart account. Instructions are located on the last page of this paperwork. If you have not heard from [place] regarding the results in 2 weeks, please contact this office.

## 2020-10-15 NOTE — Progress Notes (Signed)
1/27/202211:19 AM  Jose Kelley May 24, 1966, 55 y.o., male [id number]  Chief Complaint  Patient presents with  . back pain     Low back pain - worse when laying back . Started 2 days ago - taking ibuprofen   . Medication Refill    Atorvastatin, lisinopril/hctz, metformin   . Cough    Stopped smoking 2 months ago , reports cough at night     HPI:   Patient is a 55 y.o. male with past medical history significant for HTN, HLD, DM who presents today for cough, medication refills, and back pain.   Back pain: work Tues morning Standing at work aggravated pain Take Ibuprofen for this Denies trauma, woke up with pain Back spasms in the past   Vaccines Covid:done NHA:FBXUXYBF Tetanus:today  Diabetes Foot exam: due Optho? Needs to schedule Home monitoring? WNL Metformin $RemoveBefor'500mg'hNvbBduVQIbS$  daily Lab Results  Component Value Date   HGBA1C 6.5 (H) 06/25/2019   HTN Lisinopril/ HCTZ 20/25 BP Readings from Last 3 Encounters:  10/15/20 138/90  10/19/18 92/68  08/14/18 108/73   HLD Atorvastatin $RemoveBeforeDE'40mg'culZdXeQxqtHcup$  daily Lab Results  Component Value Date   CHOL 170 06/25/2019   HDL 40 06/25/2019   LDLCALC 97 06/25/2019   TRIG 191 (H) 06/25/2019   CHOLHDL 4.3 06/25/2019   The 10-year ASCVD risk score Mikey Bussing DC Jr., et al., 2013) is: 22.9%   Values used to calculate the score:     Age: 16 years     Sex: Male     Is Non-Hispanic African American: Yes     Diabetic: Yes     Tobacco smoker: No     Systolic Blood Pressure: 383 mmHg     Is BP treated: Yes     HDL Cholesterol: 40 mg/dL     Total Cholesterol: 170 mg/dL  Stopped smoking 2.5 months ago Night cough is bothersome During the day as well, but not as bed Worse when laying done Denies issues with asthma or allergies or seasonal  Denies heartburn, belching, bad taste in mouth   Depression screen [facility] 2/9 10/15/2020 06/24/2019 05/25/2018  Decreased Interest 0 0 0  Down, Depressed, Hopeless - 0 0  PHQ - 2 Score 0 0 0    Fall Risk   10/15/2020 06/24/2019 05/25/2018 04/15/2017 12/17/2016  Falls in the past year? 0 0 No No No  Number falls in past yr: 0 0 - - -  Injury with Fall? 0 0 - - -  Follow up Falls evaluation completed - - - -     No Known Allergies  Prior to Admission medications   Medication Sig Start Date End Date Taking? Authorizing Provider  atorvastatin (LIPITOR) 40 MG tablet Take 1 tablet (40 mg total) by mouth daily. 06/24/19  Yes Wendie Agreste, MD  blood glucose meter kit and supplies KIT Dispense based on patient and insurance preference. Use up to four times daily as directed. E11.65 01/14/17  Yes Shawnee Knapp, MD  glucose blood test strip Use as instructed 10/21/19  Yes Wendie Agreste, MD  lisinopril-hydrochlorothiazide (ZESTORETIC) 20-25 MG tablet Take 1 tablet by mouth daily. 06/24/19  Yes Wendie Agreste, MD  metFORMIN (GLUCOPHAGE) 500 MG tablet Take 1 tablet (500 mg total) by mouth daily with breakfast. 06/24/19  Yes Wendie Agreste, MD    Past Medical History:  Diagnosis Date  . Diabetes mellitus without complication ([place])   . Hypertension     Past Surgical History:  Procedure Laterality Date  .  COLONOSCOPY    . POLYPECTOMY    . SHOULDER SURGERY Right     Social History   Tobacco Use  . Smoking status: Former Smoker    Packs/day: 0.25    Years: 10.00    Pack years: 2.50    Quit date: 07/29/2020    Years since quitting: 0.2  . Smokeless tobacco: Never Used  Substance Use Topics  . Alcohol use: Yes    Comment: Occasional beer    Family History  Problem Relation Age of Onset  . Diabetes Mother   . Stroke Mother   . Cancer Brother   . Pancreatic cancer Brother   . Breast cancer Sister   . Colon cancer Neg Hx   . Esophageal cancer Neg Hx   . Rectal cancer Neg Hx   . Stomach cancer Neg Hx   . Colon polyps Neg Hx     Review of Systems  Respiratory: Positive for cough. Negative for hemoptysis, sputum production, shortness of breath and wheezing.   Cardiovascular:  Negative for chest pain, palpitations and leg swelling.  Gastrointestinal: Positive for heartburn. Negative for abdominal pain, blood in stool, constipation, diarrhea, nausea and vomiting.  Genitourinary: Negative for dysuria, frequency and urgency.  Musculoskeletal: Positive for back pain. Negative for falls, joint pain, myalgias and neck pain.  Neurological: Negative for headaches.  Endo/Heme/Allergies: Negative for polydipsia.     OBJECTIVE:  Today's Vitals   10/15/20 1018  BP: 138/90  Pulse: 77  Temp: 98 F (36.7 C)  SpO2: 96%  Weight: 161 lb (73 kg)  Height: $Remove'5\' 8"'OEfWfcx$  (1.727 m)   Body mass index is 24.48 kg/m.   Physical Exam Vitals reviewed.  Constitutional:      Appearance: Normal appearance.  HENT:     Head: Normocephalic and atraumatic.  Eyes:     Conjunctiva/sclera: Conjunctivae normal.     Pupils: Pupils are equal, round, and reactive to light.  Cardiovascular:     Rate and Rhythm: Normal rate and regular rhythm.     Pulses: Normal pulses.     Heart sounds: Normal heart sounds. No murmur heard. No friction rub. No gallop.   Pulmonary:     Effort: Pulmonary effort is normal. No respiratory distress.     Breath sounds: Normal breath sounds. No stridor. No wheezing or rales.  Abdominal:     General: Bowel sounds are normal. There is no distension.     Palpations: Abdomen is soft. There is no mass.     Tenderness: There is no abdominal tenderness. There is no right CVA tenderness, left CVA tenderness, guarding or rebound.  Musculoskeletal:     Thoracic back: Normal.     Lumbar back: Normal.     Right lower leg: No edema.     Left lower leg: No edema.  Skin:    General: Skin is warm and dry.  Neurological:     General: No focal deficit present.     Mental Status: He is alert and oriented to person, place, and time.  Psychiatric:        Mood and Affect: Mood normal.        Behavior: Behavior normal.     No results found for this or any previous visit  (from the past 24 hour(s)).  No results found.   ASSESSMENT and PLAN  Problem List Items Addressed This Visit      Cardiovascular and Mediastinum   Essential hypertension   Relevant Medications   atorvastatin (LIPITOR) 40 MG  tablet   lisinopril-hydrochlorothiazide (ZESTORETIC) 20-25 MG tablet   Other Relevant Orders   CMP14+EGFR     Endocrine   Type 2 diabetes mellitus with hyperglycemia, without long-term current use of insulin (HCC) - Primary   Relevant Medications   atorvastatin (LIPITOR) 40 MG tablet   lisinopril-hydrochlorothiazide (ZESTORETIC) 20-25 MG tablet   metFORMIN (GLUCOPHAGE) 500 MG tablet   Other Relevant Orders   Hemoglobin A1c   Ambulatory referral to Ophthalmology     Other   Hyperlipidemia   Relevant Medications   atorvastatin (LIPITOR) 40 MG tablet   lisinopril-hydrochlorothiazide (ZESTORETIC) 20-25 MG tablet   Other Relevant Orders   Lipid Panel    Other Visit Diagnoses    Screening for HIV (human immunodeficiency virus)       Relevant Orders   HIV Antibody (routine testing w rflx)   Encounter for hepatitis C screening test for low risk patient       Relevant Orders   Hepatitis C antibody   Back spasm       Relevant Medications   cyclobenzaprine (FLEXERIL) 10 MG tablet   Encounter for immunization       Relevant Orders   Td : Tetanus/diphtheria >7yo Preservative  free (Completed)   Gastroesophageal reflux disease without esophagitis       Relevant Medications   pantoprazole (PROTONIX) 40 MG tablet     Plan . Will follow up with lab results . Chronic conditions stable on current regimens (DM, HTN, HLD) . TD done today . Declined flu, r/se/b discussed . Referral placed for optho eval . Started protonix, will f/u in 2 months with GERD/cough. Discussed after one month of continuous use to try OTC pepcid as needed, if becomes worse can restart protonix and will f/u at next appointment . Flexeril for back pain, continue OTC ibuprofen,  stretches provided, encouraged ice/heat and rest, declined PT at this time   Return in about 2 months (around 12/13/2020) for Medication follow up.    Jose Foley Ayomide Purdy, FNP-BC Primary Care at [place], [place] [address] Ph.  [phone] Fax [phone]

## 2020-10-16 LAB — CMP14+EGFR
ALT: 14 IU/L (ref 0–44)
AST: 15 IU/L (ref 0–40)
Albumin/Globulin Ratio: 1.7 (ref 1.2–2.2)
Albumin: 4.3 g/dL (ref 3.8–4.9)
Alkaline Phosphatase: 78 IU/L (ref 44–121)
BUN/Creatinine Ratio: 9 (ref 9–20)
BUN: 8 mg/dL (ref 6–24)
Bilirubin Total: 0.3 mg/dL (ref 0.0–1.2)
CO2: 21 mmol/L (ref 20–29)
Calcium: 9 mg/dL (ref 8.7–10.2)
Chloride: 106 mmol/L (ref 96–106)
Creatinine, Ser: 0.94 mg/dL (ref 0.76–1.27)
GFR calc Af Amer: 105 mL/min/{1.73_m2} (ref >59)
GFR calc non Af Amer: 91 mL/min/{1.73_m2} (ref >59)
Globulin, Total: 2.5 g/dL (ref 1.5–4.5)
Glucose: 106 mg/dL — ABNORMAL HIGH (ref 65–99)
Potassium: 4.1 mmol/L (ref 3.5–5.2)
Sodium: 141 mmol/L (ref 134–144)
Total Protein: 6.8 g/dL (ref 6.0–8.5)

## 2020-10-16 LAB — LIPID PANEL
Chol/HDL Ratio: 4.4 ratio (ref 0.0–5.0)
Cholesterol, Total: 171 mg/dL (ref 100–199)
HDL: 39 mg/dL — ABNORMAL LOW (ref >39)
LDL Chol Calc (NIH): 106 mg/dL — ABNORMAL HIGH (ref 0–99)
Triglycerides: 146 mg/dL (ref 0–149)
VLDL Cholesterol Cal: 26 mg/dL (ref 5–40)

## 2020-10-16 LAB — HEMOGLOBIN A1C
Est. average glucose Bld gHb Est-mCnc: 146 mg/dL
Hgb A1c MFr Bld: 6.7 % — ABNORMAL HIGH (ref 4.8–5.6)

## 2020-10-16 LAB — HIV ANTIBODY (ROUTINE TESTING W REFLEX): HIV Screen 4th Generation wRfx: NONREACTIVE

## 2020-10-16 LAB — HEPATITIS C ANTIBODY: Hep C Virus Ab: <0.1 s/co ratio (ref 0.0–0.9)

## 2020-10-18 ENCOUNTER — Other Ambulatory Visit: Payer: Self-pay | Admitting: Family Medicine

## 2020-10-18 DIAGNOSIS — E7800 Pure hypercholesterolemia, unspecified: Secondary | ICD-10-CM

## 2020-10-18 DIAGNOSIS — E1165 Type 2 diabetes mellitus with hyperglycemia: Secondary | ICD-10-CM

## 2020-10-18 MED ORDER — METFORMIN HCL 500 MG PO TABS: 1000.0000 mg | ORAL_TABLET | Freq: Every day | ORAL | 1 refills | Status: DC

## 2020-10-18 MED ORDER — ATORVASTATIN CALCIUM 40 MG PO TABS
60.0000 mg | ORAL_TABLET | Freq: Every day | ORAL | 1 refills | Status: DC
Start: 2020-10-18 — End: 2021-06-10

## 2020-10-20 DIAGNOSIS — Z20822 Contact with and (suspected) exposure to covid-19: Secondary | ICD-10-CM | POA: Diagnosis not present

## 2020-10-24 DIAGNOSIS — Z20822 Contact with and (suspected) exposure to covid-19: Secondary | ICD-10-CM | POA: Diagnosis not present

## 2020-11-26 ENCOUNTER — Encounter: Payer: Self-pay | Admitting: Family Medicine

## 2020-11-30 ENCOUNTER — Encounter: Payer: Self-pay | Admitting: Family Medicine

## 2020-11-30 ENCOUNTER — Telehealth (INDEPENDENT_AMBULATORY_CARE_PROVIDER_SITE_OTHER): Payer: BC Managed Care – PPO | Admitting: Family Medicine

## 2020-11-30 ENCOUNTER — Ambulatory Visit: Payer: Self-pay | Admitting: *Deleted

## 2020-11-30 DIAGNOSIS — R059 Cough, unspecified: Secondary | ICD-10-CM | POA: Diagnosis not present

## 2020-11-30 MED ORDER — HYDROCOD POLST-CPM POLST ER 10-8 MG/5ML PO SUER
5.0000 mL | Freq: Every evening | ORAL | 0 refills | Status: DC | PRN
Start: 2020-11-30 — End: 2020-12-01

## 2020-11-30 MED ORDER — CETIRIZINE HCL 10 MG PO TABS: 10.0000 mg | ORAL_TABLET | Freq: Every day | ORAL | 11 refills | Status: DC

## 2020-11-30 MED ORDER — FLUTICASONE PROPIONATE 50 MCG/ACT NA SUSP: 2.0000 | Freq: Every day | NASAL | 6 refills | Status: DC

## 2020-11-30 NOTE — Telephone Encounter (Signed)
Opened by mistake. Disregard

## 2020-11-30 NOTE — Patient Instructions (Addendum)
Cough, Adult A cough helps to clear your throat and lungs. A cough may be a sign of an illness or another medical condition. An acute cough may only last 2-3 weeks, while a chronic cough may last 8 or more weeks. Many things can cause a cough. They include:  Germs (viruses or bacteria) that attack the airway.  Breathing in things that bother (irritate) your lungs.  Allergies.  Asthma.  Mucus that runs down the back of your throat (postnasal drip).  Smoking.  Acid backing up from the stomach into the tube that moves food from the mouth to the stomach (gastroesophageal reflux).  Some medicines.  Lung problems.  Other medical conditions, such as heart failure or a blood clot in the lung (pulmonary embolism). Follow these instructions at home: Medicines  Take over-the-counter and prescription medicines only as told by your doctor.  Talk with your doctor before you take medicines that stop a cough (cough suppressants). Lifestyle  Do not smoke, and try not to be around smoke. Do not use any products that contain nicotine or tobacco, such as cigarettes, e-cigarettes, and chewing tobacco. If you need help quitting, ask your doctor.  Drink enough fluid to keep your pee (urine) pale yellow.  Avoid caffeine.  Do not drink alcohol if your doctor tells you not to drink.   General instructions  Watch for any changes in your cough. Tell your doctor about them.  Always cover your mouth when you cough.  Stay away from things that make you cough, such as perfume, candles, campfire smoke, or cleaning products.  If the air is dry, use a cool mist vaporizer or humidifier in your home.  If your cough is worse at night, try using extra pillows to raise your head up higher while you sleep.  Rest as needed.  Keep all follow-up visits as told by your doctor. This is important.   Contact a doctor if:  You have new symptoms.  You cough up pus.  Your cough does not get better after 2-3  weeks, or your cough gets worse.  Cough medicine does not help your cough and you are not sleeping well.  You have pain that gets worse or pain that is not helped with medicine.  You have a fever.  You are losing weight and you do not know why.  You have night sweats. Get help right away if:  You cough up blood.  You have trouble breathing.  Your heartbeat is very fast. These symptoms may be an emergency. Do not wait to see if the symptoms will go away. Get medical help right away. Call your local emergency services (911 in the [place] Do not drive yourself to the hospital. Summary  A cough helps to clear your throat and lungs. Many things can cause a cough.  Take over-the-counter and prescription medicines only as told by your doctor.  Always cover your mouth when you cough.  Contact a doctor if you have new symptoms or you have a cough that does not get better or gets worse. This information is not intended to replace advice given to you by your health care provider. Make sure you discuss any questions you have with your health care provider. Document Revised: 10/25/2019 Document Reviewed: 09/24/2018 Elsevier Patient Education  2021 [employer].    If you have lab work done today you will be contacted with your lab results within the next 2 weeks.  If you have not heard from [place] then please contact  us. The fastest way to get your results is to register for My Chart.   IF you received an x-ray today, you will receive an invoice from [facility]. Please contact [facility] at [phone] with questions or concerns regarding your invoice.   IF you received labwork today, you will receive an invoice from [facility]. Please contact [facility] at [phone] with questions or concerns regarding your invoice.   Our billing staff will not be able to assist you with questions regarding bills from these companies.  You will be contacted with the lab results as soon  as they are available. The fastest way to get your results is to activate your My Chart account. Instructions are located on the last page of this paperwork. If you have not heard from [place] regarding the results in 2 weeks, please contact this office.

## 2020-11-30 NOTE — Progress Notes (Signed)
Virtual Visit Note  I connected with patient on 11/30/20 at 1627 by Epic video visit and verified that I am speaking with the correct person using two identifiers. Jose Kelley is currently located at home and no family members are currently with them during visit. The provider, Laurita Quint Leala Bryand, FNP is located in their office at time of visit.  I discussed the limitations, risks, security and privacy concerns of performing an evaluation and management service by telephone and the availability of in person appointments. I also discussed with the patient that there may be a patient responsible charge related to this service. The patient expressed understanding and agreed to proceed.   Chief Complaint  Patient presents with  . Cough    And wheezing for about a month - worse at night - difficulty sleeping due to cough     HPI ? Had a cough last OV Wheezing at night Symptoms started end of January But now it feels like it is getting worse Has a cough during the day, but it is worse at night Denies history of allergies or asthma Had a cold and that's how it started Got over the cold Feels like the pain is epigastric Denies history of heartburn Tried something for reflux but that didn't help Has been using Mucinex DM for this Has noticed a lot of white thick phlegm Always a wet cough Mostly Aundra Pung a night it is a problem    No Known Allergies  Prior to Admission medications   Medication Sig Start Date End Date Taking? Authorizing Provider  atorvastatin (LIPITOR) 40 MG tablet Take 1.5 tablets (60 mg total) by mouth daily. 10/18/20  Yes Enedelia Martorelli, Laurita Quint, FNP  blood glucose meter kit and supplies KIT Dispense based on patient and insurance preference. Use up to four times daily as directed. E11.65 01/14/17  Yes Shawnee Knapp, MD  cyclobenzaprine (FLEXERIL) 10 MG tablet Take 1 tablet (10 mg total) by mouth 3 (three) times daily as needed for muscle spasms. 10/15/20  Yes Jihan Rudy, Laurita Quint, FNP   glucose blood test strip Use as instructed 10/21/19  Yes Wendie Agreste, MD  lisinopril-hydrochlorothiazide (ZESTORETIC) 20-25 MG tablet Take 1 tablet by mouth daily. 10/15/20  Yes Ernestine Rohman, Laurita Quint, FNP  metFORMIN (GLUCOPHAGE) 500 MG tablet Take 2 tablets (1,000 mg total) by mouth daily with breakfast. 10/18/20  Yes Jiah Bari, Laurita Quint, FNP  pantoprazole (PROTONIX) 40 MG tablet Take 1 tablet (40 mg total) by mouth daily. 10/15/20  Yes Quenesha Douglass, Laurita Quint, FNP    Past Medical History:  Diagnosis Date  . Diabetes mellitus without complication ([place])   . Hypertension     Past Surgical History:  Procedure Laterality Date  . COLONOSCOPY    . POLYPECTOMY    . SHOULDER SURGERY Right     Social History   Tobacco Use  . Smoking status: Former Smoker    Packs/day: 0.25    Years: 10.00    Pack years: 2.50    Quit date: 07/29/2020    Years since quitting: 0.3  . Smokeless tobacco: Never Used  Substance Use Topics  . Alcohol use: Yes    Comment: Occasional beer    Family History  Problem Relation Age of Onset  . Diabetes Mother   . Stroke Mother   . Cancer Brother   . Pancreatic cancer Brother   . Breast cancer Sister   . Colon cancer Neg Hx   . Esophageal cancer Neg Hx   . Rectal  cancer Neg Hx   . Stomach cancer Neg Hx   . Colon polyps Neg Hx     Review of Systems  Constitutional: Negative for chills, fever and malaise/fatigue.  Respiratory: Positive for cough, sputum production and wheezing. Negative for shortness of breath.   Cardiovascular: Negative for chest pain and palpitations.  Gastrointestinal: Negative for heartburn, nausea and vomiting.       Epigastric pain  Neurological: Negative for dizziness and headaches.    Objective  Constitutional:      General: Not in acute distress.    Appearance: Normal appearance. Not ill-appearing.   Pulmonary:     Effort: Pulmonary effort is normal. No respiratory distress.  Neurological:     Mental Status: Alert and oriented to person,  place, and time.  Psychiatric:        Mood and Affect: Mood normal.        Behavior: Behavior normal.     ASSESSMENT and PLAN  Problem List Items Addressed This Visit   None   Visit Diagnoses    Cough    -  Primary   Relevant Medications   cetirizine (ZYRTEC) 10 MG tablet   fluticasone (FLONASE) 50 MCG/ACT nasal spray   chlorpheniramine-HYDROcodone (TUSSIONEX PENNKINETIC ER) 10-8 MG/5ML SUER      Plan . RTC/ ED precautions provided . R/se/b of medications discussed  Return if symptoms worsen or fail to improve.    The above assessment and management plan was discussed with the patient. The patient verbalized understanding of and has agreed to the management plan. Patient is aware to call the clinic if symptoms persist or worsen. Patient is aware when to return to the clinic for a follow-up visit. Patient educated on when it is appropriate to go to the emergency department.     Huston Foley Orla Estrin, FNP-BC Primary Care at [facility] [place], [place] [address] Ph.  [phone] Fax [phone]

## 2020-12-01 ENCOUNTER — Other Ambulatory Visit: Payer: Self-pay | Admitting: Family Medicine

## 2020-12-01 ENCOUNTER — Telehealth: Payer: Self-pay | Admitting: Family Medicine

## 2020-12-01 DIAGNOSIS — R059 Cough, unspecified: Secondary | ICD-10-CM

## 2020-12-01 MED ORDER — HYDROCOD POLST-CPM POLST ER 10-8 MG/5ML PO SUER: 5.0000 mL | Freq: Two times a day (BID) | ORAL | 0 refills | Status: AC | PRN

## 2020-12-01 NOTE — Telephone Encounter (Signed)
Robert from [facility] is calling because the problem is patient has never had a Hydrocodone so he is limited to 7 days supply / that would be 35 mls .but, they cant break the package , Our options are finding a pharmacy that has stock bottle that is breakable (larger stock bottle ) or changing the product    Please advise , Herbie Baltimore can be reached at   [phone]

## 2020-12-01 NOTE — Telephone Encounter (Signed)
I resent the prescription.

## 2020-12-01 NOTE — Telephone Encounter (Signed)
Patient called to check status of Rx for hydrocodone. Spoke with CMA Noah Delaine; she will ask the provider whether he wants to rewrite the Rx or send it to a different office, and she'll get back to the patient.

## 2020-12-01 NOTE — Telephone Encounter (Signed)
Got ahold of the pharmacy, they only have 70 mL bottles of tussinex, wanted to figure out how we wanted to proceed. Advised dispense 70 mL and we can send more if it is still needed after that.

## 2020-12-01 NOTE — Telephone Encounter (Signed)
Tussinez can only be given as 7 day supply 71mL bc pt has not had before but pharmacy only has 70 mL bottles so pt needs other Rx

## 2020-12-01 NOTE — Telephone Encounter (Signed)
Patient called to follow up on status of Rx for cough medication; patient stated pharmacy did not fill the Rx and would call provider with questions.   Please advise at [phone]

## 2020-12-01 NOTE — Telephone Encounter (Signed)
Called pharmacy waited 5 min and no one answered will have to try pharmacy again later

## 2020-12-02 NOTE — Telephone Encounter (Signed)
Spoke to patient to inform him that the Rx has been resent.

## 2020-12-07 ENCOUNTER — Other Ambulatory Visit: Payer: Self-pay

## 2020-12-07 ENCOUNTER — Ambulatory Visit (INDEPENDENT_AMBULATORY_CARE_PROVIDER_SITE_OTHER): Payer: BC Managed Care – PPO

## 2020-12-07 ENCOUNTER — Ambulatory Visit
Admission: EM | Admit: 2020-12-07 | Discharge: 2020-12-07 | Disposition: A | Discharge status: Home / Self Care | Payer: BC Managed Care – PPO | Attending: Family Medicine | Admitting: Family Medicine

## 2020-12-07 DIAGNOSIS — U099 Post covid-19 condition, unspecified: Secondary | ICD-10-CM

## 2020-12-07 DIAGNOSIS — R0602 Shortness of breath: Secondary | ICD-10-CM

## 2020-12-07 DIAGNOSIS — J209 Acute bronchitis, unspecified: Secondary | ICD-10-CM

## 2020-12-07 DIAGNOSIS — R059 Cough, unspecified: Secondary | ICD-10-CM | POA: Diagnosis not present

## 2020-12-07 DIAGNOSIS — Z8616 Personal history of COVID-19: Secondary | ICD-10-CM | POA: Diagnosis not present

## 2020-12-07 MED ORDER — ALBUTEROL SULFATE HFA 108 (90 BASE) MCG/ACT IN AERS
2.0000 | INHALATION_SPRAY | RESPIRATORY_TRACT | Status: DC | PRN
  Administered 2020-12-07: 2 via RESPIRATORY_TRACT

## 2020-12-07 MED ORDER — PREDNISOLONE SODIUM PHOSPHATE 15 MG/5ML PO SOLN
30.0000 mg | Freq: Once | ORAL | Status: AC
  Administered 2020-12-07: 30 mg via ORAL

## 2020-12-07 MED ORDER — DEXAMETHASONE 1 MG/ML PO CONC: 10.0000 mg | Freq: Once | ORAL | Status: DC

## 2020-12-07 MED ORDER — PREDNISONE 20 MG PO TABS: 20.0000 mg | ORAL_TABLET | Freq: Every day | ORAL | 0 refills | Status: AC

## 2020-12-07 MED ORDER — DOXYCYCLINE HYCLATE 100 MG PO CAPS: 100.0000 mg | ORAL_CAPSULE | Freq: Two times a day (BID) | ORAL | 0 refills | Status: DC

## 2020-12-07 MED ORDER — BENZONATATE 100 MG PO CAPS: 100.0000 mg | ORAL_CAPSULE | Freq: Three times a day (TID) | ORAL | 0 refills | Status: DC | PRN

## 2020-12-07 NOTE — Discharge Instructions (Addendum)
Use  albuterol (VENTOLIN HFA) 108 (90 Base) MCG/ACT inhaler 2 puff, Inhalation, Every 4 hours PRN, wheezing, shortness of breath, Starting on Mon 12/07/20   Start prednisone tablets tomorrow you received a dose of prednisone here in clinic.  Start doxycycline today you will take twice daily for the next 10 days. Return to this clinic in 2 days to have your lungs listened to and recheck your oxygen level.

## 2020-12-07 NOTE — ED Triage Notes (Signed)
Pt c/o SOB at night while laying x102month. States having productive cough with white sputum and chest congestion x2wks. States tx'd last week at a UC/PCP with cough medicine with no relief. Denies SOB at this time.

## 2020-12-07 NOTE — ED Provider Notes (Signed)
[facility]    CSN: [id number] Arrival date & time: 12/07/20  0820      History   Chief Complaint Chief Complaint  Patient presents with  . Shortness of Breath  . Cough    HPI Jose Kelley is a 54 y.o. male.   HPI Patient reports several days of gradually worsening cough and shortness of breath. Patient was infected with COVID-19 virus later part of January. No complications or ER visit related to COVID. Recovered however has experienced SOB and non productive cough x several weeks. .Prescribed Tussionex  For  cough. . No recent antibiotic use. Symptoms worsened over the last 2 days and last night he experienced worst episode of dyspnea. On arrival patients oxygen level averaging 89-92% and is not responding to deep breathing. Baseline per review of EMR 96%-98%. No wheezing. No history of lung or heart disease. He is a current smoker, however no formal workup or diagnosis of COPD. His PCP office is closing for good this week and was unable to be seen at their office.  Past Medical History:  Diagnosis Date  . Diabetes mellitus without complication ([place])   . Hypertension     Patient Active Problem List   Diagnosis Date Noted  . Type 2 diabetes mellitus with hyperglycemia, without long-term current use of insulin ([place]) 01/14/2017  . Hyperlipidemia 01/14/2017  . Tobacco use disorder 01/14/2017  . Essential hypertension 01/14/2017    Past Surgical History:  Procedure Laterality Date  . COLONOSCOPY    . POLYPECTOMY    . SHOULDER SURGERY Right        Home Medications    Prior to Admission medications   Medication Sig Start Date End Date Taking? Authorizing Provider  atorvastatin (LIPITOR) 40 MG tablet Take 1.5 tablets (60 mg total) by mouth daily. 10/18/20   Just, Laurita Quint, FNP  blood glucose meter kit and supplies KIT Dispense based on patient and insurance preference. Use up to four times daily as directed. E11.65 01/14/17   Shawnee Knapp, MD  cetirizine  (ZYRTEC) 10 MG tablet Take 1 tablet (10 mg total) by mouth at bedtime. 11/30/20   Just, Laurita Quint, FNP  chlorpheniramine-HYDROcodone (TUSSIONEX PENNKINETIC ER) 10-8 MG/5ML SUER Take 5 mLs by mouth every 12 (twelve) hours as needed for up to 7 days for cough. 12/01/20 12/08/20  Just, Laurita Quint, FNP  cyclobenzaprine (FLEXERIL) 10 MG tablet Take 1 tablet (10 mg total) by mouth 3 (three) times daily as needed for muscle spasms. 10/15/20   Just, Laurita Quint, FNP  fluticasone (FLONASE) 50 MCG/ACT nasal spray Place 2 sprays into both nostrils daily. 11/30/20   Just, Laurita Quint, FNP  glucose blood test strip Use as instructed 10/21/19   Wendie Agreste, MD  lisinopril-hydrochlorothiazide (ZESTORETIC) 20-25 MG tablet Take 1 tablet by mouth daily. 10/15/20   Just, Laurita Quint, FNP  metFORMIN (GLUCOPHAGE) 500 MG tablet Take 2 tablets (1,000 mg total) by mouth daily with breakfast. 10/18/20   Just, Laurita Quint, FNP  pantoprazole (PROTONIX) 40 MG tablet Take 1 tablet (40 mg total) by mouth daily. 10/15/20   Just, Laurita Quint, FNP    Family History Family History  Problem Relation Age of Onset  . Diabetes Mother   . Stroke Mother   . Cancer Brother   . Pancreatic cancer Brother   . Breast cancer Sister   . Colon cancer Neg Hx   . Esophageal cancer Neg Hx   . Rectal cancer Neg Hx   .  Stomach cancer Neg Hx   . Colon polyps Neg Hx     Social History Social History   Tobacco Use  . Smoking status: Current Every Day Smoker    Packs/day: 0.25    Years: 10.00    Pack years: 2.50    Types: Cigarettes    Last attempt to quit: 07/29/2020    Years since quitting: 0.3  . Smokeless tobacco: Never Used  Vaping Use  . Vaping Use: Never used  Substance Use Topics  . Alcohol use: Yes    Comment: Occasional beer  . Drug use: No     Allergies   Patient has no known allergies.   Review of Systems Review of Systems Pertinent negatives listed in HPI  Physical Exam Triage Vital Signs ED Triage Vitals  Enc Vitals Group      BP 12/07/20 0834 (!) 153/104     Pulse Rate 12/07/20 0834 87     Resp 12/07/20 0834 18     Temp 12/07/20 0834 98.2 F (36.8 C)     Temp Source 12/07/20 0834 Oral     SpO2 12/07/20 0834 (!) 89 %     Weight --      Height --      Head Circumference --      Peak Flow --      Pain Score 12/07/20 0835 0     Pain Loc --      Pain Edu? --      Excl. in [place]? --    No data found.  Updated Vital Signs BP (!) 153/104 (BP Location: Left Arm)   Pulse 87   Temp 98.2 F (36.8 C) (Oral)   Resp 18   SpO2 90%   Visual Acuity Right Eye Distance:   Left Eye Distance:   Bilateral Distance:    Right Eye Near:   Left Eye Near:    Bilateral Near:     Physical Exam Constitutional:      General: He is not in acute distress.    Appearance: He is well-developed. He is not toxic-appearing.  HENT:     Head: Normocephalic.  Eyes:     Extraocular Movements: Extraocular movements intact.     Pupils: Pupils are equal, round, and reactive to light.  Cardiovascular:     Rate and Rhythm: Normal rate and regular rhythm.  Pulmonary:     Effort: Pulmonary effort is normal. No tachypnea or accessory muscle usage.     Breath sounds: Examination of the right-upper field reveals decreased breath sounds. Examination of the left-upper field reveals decreased breath sounds. Decreased breath sounds present.     Comments: Course lung sounds noted upper lung fields  Musculoskeletal:        General: Normal range of motion.  Skin:    General: Skin is warm and dry.  Neurological:     General: No focal deficit present.     Mental Status: He is alert and oriented to person, place, and time.  Psychiatric:        Mood and Affect: Mood normal.        Behavior: Behavior normal.    UC Treatments / Results  Labs (all labs ordered are listed, but only abnormal results are displayed) Labs Reviewed - No data to display  EKG   Radiology DG Chest 2 View  Result Date: 12/07/2020 CLINICAL DATA:  55 year old  male with persistent shortness of breath and cough following COVID-19 in January. EXAM: CHEST - 2 VIEW  COMPARISON:  Chest radiographs 01/14/2017. FINDINGS: Lung volumes and mediastinal contours are stable since 2018 and within normal limits. Visualized tracheal air column is within normal limits. Lung markings appear stable since 2018, within normal limits. No pneumonia, pneumothorax, pleural effusion or other pulmonary abnormality identified. Stable visualized osseous structures. Chronic postoperative changes at the right shoulder. Negative visible bowel gas pattern. IMPRESSION: Stable since 2018.  No cardiopulmonary abnormality identified. Electronically Signed   By: Genevie Ann M.D.   On: 12/07/2020 09:07    Procedures Procedures (including critical care time)  Medications Ordered in UC Medications - No data to display  Initial Impression / Assessment and Plan / UC Course  I have reviewed the triage vital signs and the nursing notes.  Pertinent labs & imaging results that were available during my care of the patient were reviewed by me and considered in my medical decision making (see chart for details).     Imaging of lungs negative for any acute findings. Given lung exam, low oxygen level, will treat for lower respiratory infection with medications per ED discharge medication. Albuterol 2 puffs with chamber administered here in clinic today, continue at home 2 puffs every 4-6 hours as needed for SOB or chest tightness. Concern for possible chronic bronchitis vs post COVID respiratory condition. Information given to establish care with [facility] Care at [facility]. Given low oxygen levels, return in  days for recheck lungs and oxygen level. ER if breathing worsens or chest tightness pressure develops. Final Clinical Impressions(s) / UC Diagnoses   Final diagnoses:  Acute bronchitis, unspecified organism  Shortness of breath  Post-COVID-19 condition     Discharge Instructions     Use  albuterol  (VENTOLIN HFA) 108 (90 Base) MCG/ACT inhaler 2 puff, Inhalation, Every 4 hours PRN, wheezing, shortness of breath, Starting on Mon 12/07/20   Start prednisone tablets tomorrow you received a dose of prednisone here in clinic.  Start doxycycline today you will take twice daily for the next 10 days. Return to this clinic in 2 days to have your lungs listened to and recheck your oxygen level.    ED Prescriptions    Medication Sig Dispense Auth. Provider   doxycycline (VIBRAMYCIN) 100 MG capsule Take 1 capsule (100 mg total) by mouth 2 (two) times daily. 20 capsule Scot Jun, FNP   predniSONE (DELTASONE) 20 MG tablet Take 1 tablet (20 mg total) by mouth daily with breakfast for 5 days. 5 tablet Scot Jun, FNP   benzonatate (TESSALON) 100 MG capsule Take 1-2 capsules (100-200 mg total) by mouth 3 (three) times daily as needed for cough. 40 capsule Scot Jun, FNP     PDMP not reviewed this encounter.   Scot Jun, [place] 12/09/20 2119

## 2020-12-09 ENCOUNTER — Ambulatory Visit
Admission: EM | Admit: 2020-12-09 | Discharge: 2020-12-09 | Disposition: A | Discharge status: Home / Self Care | Payer: BC Managed Care – PPO

## 2020-12-09 ENCOUNTER — Other Ambulatory Visit: Payer: Self-pay

## 2020-12-09 DIAGNOSIS — J209 Acute bronchitis, unspecified: Secondary | ICD-10-CM | POA: Diagnosis not present

## 2020-12-09 DIAGNOSIS — Z09 Encounter for follow-up examination after completed treatment for conditions other than malignant neoplasm: Secondary | ICD-10-CM

## 2020-12-09 NOTE — Discharge Instructions (Signed)
Please continue doxycycline, prednisone course and cough medicine provided Follow-up if any symptoms not continuing to improve or worsen

## 2020-12-09 NOTE — ED Triage Notes (Signed)
Pt was seen on Monday and told to follow up today for oxygen and lungs recheck. Pt states feels much better today and sleeping at night.

## 2020-12-10 NOTE — ED Provider Notes (Signed)
[facility]    CSN: [id number] Arrival date & time: 12/09/20  1710      History   Chief Complaint Chief Complaint  Patient presents with  . Follow-up    HPI Jose Kelley is a 55 y.o. male history of hypertension, DM type II presenting today for follow-up from 2 days ago.  Seen here on Monday treated for bronchitis, negative x-ray, O2 around 90%.  Started on doxycycline and steroids.  Reports significant improvement since then.  Reports still some mild cough, but denies any significant shortness of breath or difficulty breathing.  HPI  Past Medical History:  Diagnosis Date  . Diabetes mellitus without complication ([place])   . Hypertension     Patient Active Problem List   Diagnosis Date Noted  . Type 2 diabetes mellitus with hyperglycemia, without long-term current use of insulin ([place]) 01/14/2017  . Hyperlipidemia 01/14/2017  . Tobacco use disorder 01/14/2017  . Essential hypertension 01/14/2017    Past Surgical History:  Procedure Laterality Date  . COLONOSCOPY    . POLYPECTOMY    . SHOULDER SURGERY Right        Home Medications    Prior to Admission medications   Medication Sig Start Date End Date Taking? Authorizing Provider  atorvastatin (LIPITOR) 40 MG tablet Take 1.5 tablets (60 mg total) by mouth daily. 10/18/20   Just, Laurita Quint, FNP  benzonatate (TESSALON) 100 MG capsule Take 1-2 capsules (100-200 mg total) by mouth 3 (three) times daily as needed for cough. 12/07/20   Scot Jun, FNP  blood glucose meter kit and supplies KIT Dispense based on patient and insurance preference. Use up to four times daily as directed. E11.65 01/14/17   Shawnee Knapp, MD  cetirizine (ZYRTEC) 10 MG tablet Take 1 tablet (10 mg total) by mouth at bedtime. 11/30/20   Just, Laurita Quint, FNP  cyclobenzaprine (FLEXERIL) 10 MG tablet Take 1 tablet (10 mg total) by mouth 3 (three) times daily as needed for muscle spasms. 10/15/20   Just, Laurita Quint, FNP  doxycycline  (VIBRAMYCIN) 100 MG capsule Take 1 capsule (100 mg total) by mouth 2 (two) times daily. 12/07/20   Scot Jun, FNP  fluticasone (FLONASE) 50 MCG/ACT nasal spray Place 2 sprays into both nostrils daily. 11/30/20   Just, Laurita Quint, FNP  glucose blood test strip Use as instructed 10/21/19   Wendie Agreste, MD  lisinopril-hydrochlorothiazide (ZESTORETIC) 20-25 MG tablet Take 1 tablet by mouth daily. 10/15/20   Just, Laurita Quint, FNP  metFORMIN (GLUCOPHAGE) 500 MG tablet Take 2 tablets (1,000 mg total) by mouth daily with breakfast. 10/18/20   Just, Laurita Quint, FNP  pantoprazole (PROTONIX) 40 MG tablet Take 1 tablet (40 mg total) by mouth daily. 10/15/20   Just, Laurita Quint, FNP  predniSONE (DELTASONE) 20 MG tablet Take 1 tablet (20 mg total) by mouth daily with breakfast for 5 days. 12/08/20 12/13/20  Scot Jun, FNP    Family History Family History  Problem Relation Age of Onset  . Diabetes Mother   . Stroke Mother   . Cancer Brother   . Pancreatic cancer Brother   . Breast cancer Sister   . Colon cancer Neg Hx   . Esophageal cancer Neg Hx   . Rectal cancer Neg Hx   . Stomach cancer Neg Hx   . Colon polyps Neg Hx     Social History Social History   Tobacco Use  . Smoking status: Current Every Day Smoker  Packs/day: 0.25    Years: 10.00    Pack years: 2.50    Types: Cigarettes    Last attempt to quit: 07/29/2020    Years since quitting: 0.3  . Smokeless tobacco: Never Used  Vaping Use  . Vaping Use: Never used  Substance Use Topics  . Alcohol use: Yes    Comment: Occasional beer  . Drug use: No     Allergies   Patient has no known allergies.   Review of Systems Review of Systems  Constitutional: Negative for activity change, appetite change, chills, fatigue and fever.  HENT: Positive for congestion. Negative for ear pain, rhinorrhea, sinus pressure, sore throat and trouble swallowing.   Eyes: Negative for discharge and redness.  Respiratory: Positive for cough.  Negative for chest tightness and shortness of breath.   Cardiovascular: Negative for chest pain.  Gastrointestinal: Negative for abdominal pain, diarrhea, nausea and vomiting.  Musculoskeletal: Negative for myalgias.  Skin: Negative for rash.  Neurological: Negative for dizziness, light-headedness and headaches.     Physical Exam Triage Vital Signs ED Triage Vitals  Enc Vitals Group     BP 12/09/20 1751 132/78     Pulse Rate 12/09/20 1751 82     Resp 12/09/20 1751 18     Temp 12/09/20 1751 98.1 F (36.7 C)     Temp Source 12/09/20 1751 Oral     SpO2 12/09/20 1751 93 %     Weight --      Height --      Head Circumference --      Peak Flow --      Pain Score 12/09/20 1752 0     Pain Loc --      Pain Edu? --      Excl. in [place]? --    No data found.  Updated Vital Signs BP 132/78 (BP Location: Left Arm)   Pulse 82   Temp 98.1 F (36.7 C) (Oral)   Resp 18   SpO2 93%   Visual Acuity Right Eye Distance:   Left Eye Distance:   Bilateral Distance:    Right Eye Near:   Left Eye Near:    Bilateral Near:     Physical Exam Vitals and nursing note reviewed.  Constitutional:      Appearance: He is well-developed.     Comments: No acute distress  HENT:     Head: Normocephalic and atraumatic.     Nose: Nose normal.     Mouth/Throat:     Comments: Oral mucosa pink and moist, no tonsillar enlargement or exudate. Posterior pharynx patent and nonerythematous, no uvula deviation or swelling. Normal phonation. Eyes:     Conjunctiva/sclera: Conjunctivae normal.  Cardiovascular:     Rate and Rhythm: Normal rate and regular rhythm.  Pulmonary:     Effort: Pulmonary effort is normal. No respiratory distress.     Comments: Mild expiratory wheezing in bilateral lower lung fields  O2 rechecked increased to 95/96% with deep inspiration, 93% with shallow breathing Abdominal:     General: There is no distension.  Musculoskeletal:        General: Normal range of motion.      Cervical back: Neck supple.  Skin:    General: Skin is warm and dry.  Neurological:     Mental Status: He is alert and oriented to person, place, and time.      UC Treatments / Results  Labs (all labs ordered are listed, but only abnormal results are displayed)  Labs Reviewed - No data to display  EKG   Radiology No results found.  Procedures Procedures (including critical care time)  Medications Ordered in UC Medications - No data to display  Initial Impression / Assessment and Plan / UC Course  I have reviewed the triage vital signs and the nursing notes.  Pertinent labs & imaging results that were available during my care of the patient were reviewed by me and considered in my medical decision making (see chart for details).     Symptoms improving and progressing, will proceed with continuing course of doxy and steroids, advised to return for any concerns or symptoms worsening.  Discussed strict return precautions. Patient verbalized understanding and is agreeable with plan.  Final Clinical Impressions(s) / UC Diagnoses   Final diagnoses:  Acute bronchitis, unspecified organism  Follow up     Discharge Instructions     Please continue doxycycline, prednisone course and cough medicine provided Follow-up if any symptoms not continuing to improve or worsen    ED Prescriptions    None     PDMP not reviewed this encounter.   Frimy Uffelman, [place] C, PA-C 12/10/20 1003

## 2020-12-14 ENCOUNTER — Ambulatory Visit: Payer: Self-pay | Admitting: Family Medicine

## 2021-02-01 ENCOUNTER — Ambulatory Visit: Payer: BC Managed Care – PPO | Admitting: Family

## 2021-02-03 NOTE — Progress Notes (Deleted)
Virtual Visit via Telephone Note  I connected with Jose Kelley, on 02/03/2021 at 9:04 AM by telephone due to the COVID-19 pandemic and verified that I am speaking with the correct person using two identifiers.  Due to current restrictions/limitations of in-office visits due to the COVID-19 pandemic, this scheduled clinical appointment was converted to a telehealth visit.   Consent: I discussed the limitations, risks, security and privacy concerns of performing an evaluation and management service by telephone and the availability of in person appointments. I also discussed with the patient that there may be a patient responsible charge related to this service. The patient expressed understanding and agreed to proceed.   Location of Patient: Home  Location of Provider: [facility] [place] participating in Telemedicine visit: Jose Kelley Durene Fruits, NP Elmon Else, CMA    History of Present Illness: ***   Past Medical History:  Diagnosis Date  . Diabetes mellitus without complication ([place])   . Hypertension    No Known Allergies  Current Outpatient Medications on File Prior to Visit  Medication Sig Dispense Refill  . atorvastatin (LIPITOR) 40 MG tablet Take 1.5 tablets (60 mg total) by mouth daily. 90 tablet 1  . benzonatate (TESSALON) 100 MG capsule Take 1-2 capsules (100-200 mg total) by mouth 3 (three) times daily as needed for cough. 40 capsule 0  . blood glucose meter kit and supplies KIT Dispense based on patient and insurance preference. Use up to four times daily as directed. E11.65 1 each 0  . cetirizine (ZYRTEC) 10 MG tablet Take 1 tablet (10 mg total) by mouth at bedtime. 30 tablet 11  . cyclobenzaprine (FLEXERIL) 10 MG tablet Take 1 tablet (10 mg total) by mouth 3 (three) times daily as needed for muscle spasms. 30 tablet 0  . doxycycline (VIBRAMYCIN) 100 MG capsule Take 1 capsule (100 mg total) by mouth 2 (two) times daily.  20 capsule 0  . fluticasone (FLONASE) 50 MCG/ACT nasal spray Place 2 sprays into both nostrils daily. 16 g 6  . glucose blood test strip Use as instructed 100 each 12  . lisinopril-hydrochlorothiazide (ZESTORETIC) 20-25 MG tablet Take 1 tablet by mouth daily. 90 tablet 1  . metFORMIN (GLUCOPHAGE) 500 MG tablet Take 2 tablets (1,000 mg total) by mouth daily with breakfast. 90 tablet 1  . pantoprazole (PROTONIX) 40 MG tablet Take 1 tablet (40 mg total) by mouth daily. 30 tablet 3   No current facility-administered medications on file prior to visit.    Observations/Objective: Alert and oriented x 3. Not in acute distress. Physical examination not completed as this is a telemedicine visit.  Assessment and Plan: ***  Follow Up Instructions: ***   Patient was given clear instructions to go to Emergency Department or return to medical center if symptoms don't improve, worsen, or new problems develop.The patient verbalized understanding.  I discussed the assessment and treatment plan with the patient. The patient was provided an opportunity to ask questions and all were answered. The patient agreed with the plan and demonstrated an understanding of the instructions.   The patient was advised to call back or seek an in-person evaluation if the symptoms worsen or if the condition fails to improve as anticipated.     I provided *** minutes total of non-face-to-face time during this encounter.   Camillia Herter, NP  [facility] [place], [place] 02/03/2021, 9:04 AM

## 2021-02-05 ENCOUNTER — Telehealth: Payer: BC Managed Care – PPO | Admitting: Family

## 2021-04-14 NOTE — Progress Notes (Signed)
Virtual Visit via Telephone Note  I connected with Jose Kelley, on 04/16/2021 at 9:09 AM by telephone due to the COVID-19 pandemic and verified that I am speaking with the correct person using two identifiers.  Due to current restrictions/limitations of in-office visits due to the COVID-19 pandemic, this scheduled clinical appointment was converted to a telehealth visit.   Consent: I discussed the limitations, risks, security and privacy concerns of performing an evaluation and management service by telephone and the availability of in person appointments. I also discussed with the patient that there may be a patient responsible charge related to this service. The patient expressed understanding and agreed to proceed.   Location of Patient: Home  Location of Provider: [facility] [place]   Persons participating in Telemedicine visit: Keylin Bunker Amy Stephens, NP Eboney Williams, CMA  History of Present Illness: Jose Kelley is a 55 year-old male who presents to establish care. PMH significant for essential hypertension, diabetes mellitus type 2, hyperlipidemia, and tobacco use disorder.  Current issues and/or concerns: None. Says he will need refills on Lisinopril-Hydrochlorothiazide probably sometime soon and plans to call back to let provider know when he needs it.    Past Medical History:  Diagnosis Date   Diabetes mellitus without complication ([facility])    Hypertension    No Known Allergies  Current Outpatient Medications on File Prior to Visit  Medication Sig Dispense Refill   atorvastatin (LIPITOR) 40 MG tablet Take 1.5 tablets (60 mg total) by mouth daily. 90 tablet 1   blood glucose meter kit and supplies KIT Dispense based on patient and insurance preference. Use up to four times daily as directed. E11.65 1 each 0   glucose blood test strip Use as instructed 100 each 12   lisinopril-hydrochlorothiazide (ZESTORETIC) 20-25 MG tablet Take 1 tablet  by mouth daily. 90 tablet 1   metFORMIN (GLUCOPHAGE) 500 MG tablet Take 2 tablets (1,000 mg total) by mouth daily with breakfast. 90 tablet 1   pantoprazole (PROTONIX) 40 MG tablet Take 1 tablet (40 mg total) by mouth daily. (Patient not taking: Reported on 04/16/2021) 30 tablet 3   No current facility-administered medications on file prior to visit.    Observations/Objective: Alert and oriented x 3. Not in acute distress. Physical examination not completed as this is a telemedicine visit.  Assessment and Plan: 1. Encounter to establish care: - Patient presents today to establish care.  - Return for annual physical examination, labs, and health maintenance. Arrive fasting meaning having no food for at least 8 hours prior to appointment. You may have only water or black coffee. Please take scheduled medications as normal.   Follow Up Instructions: Return for annual physical exam.    Patient was given clear instructions to go to Emergency Department or return to medical center if symptoms don't improve, worsen, or new problems develop.The patient verbalized understanding.  I discussed the assessment and treatment plan with the patient. The patient was provided an opportunity to ask questions and all were answered. The patient agreed with the plan and demonstrated an understanding of the instructions.   The patient was advised to call back or seek an in-person evaluation if the symptoms worsen or if the condition fails to improve as anticipated.    I provided 10 minutes total of non-face-to-face time during this encounter.   Amy J Stephens, NP  [facility] [place], [place] [phone] 04/16/2021, 9:09 AM  

## 2021-04-15 ENCOUNTER — Telehealth: Payer: Self-pay

## 2021-04-15 NOTE — Telephone Encounter (Signed)
..  Mr. aydan, zagorski are scheduled for a virtual visit with your provider today.    Just as we do with appointments in the office, we must obtain your consent to participate.  Your consent will be active for this visit and any virtual visit you may have with one of our providers in the next 365 days.    If you have a MyChart account, I can also send a copy of this consent to you electronically.  All virtual visits are billed to your insurance company just like a traditional visit in the office.  As this is a virtual visit, video technology does not allow for your provider to perform a traditional examination.  This may limit your provider's ability to fully assess your condition.  If your provider identifies any concerns that need to be evaluated in person or the need to arrange testing such as labs, EKG, etc, we will make arrangements to do so.    Although advances in technology are sophisticated, we cannot ensure that it will always work on either your end or our end.  If the connection with a video visit is poor, we may have to switch to a telephone visit.  With either a video or telephone visit, we are not always able to ensure that we have a secure connection.   I need to obtain your verbal consent now.   Are you willing to proceed with your visit today?   Laquarius Stemarie has provided verbal consent on 04/15/2021 for a virtual visit (video or telephone).   Johny Shears 04/15/2021  5:15 PM

## 2021-04-16 ENCOUNTER — Telehealth (INDEPENDENT_AMBULATORY_CARE_PROVIDER_SITE_OTHER): Payer: BC Managed Care – PPO | Admitting: Family

## 2021-04-16 ENCOUNTER — Encounter: Payer: Self-pay | Admitting: Family

## 2021-04-16 ENCOUNTER — Other Ambulatory Visit: Payer: Self-pay

## 2021-04-16 DIAGNOSIS — Z7689 Persons encountering health services in other specified circumstances: Secondary | ICD-10-CM

## 2021-04-16 NOTE — Progress Notes (Signed)
Pt presents for telemedicine visit to establish care °

## 2021-06-06 NOTE — Progress Notes (Signed)
Patient ID: Jose Kelley, male    DOB: 09-Mar-1966  MRN: [id number]  CC: Annual Physical Exam  Subjective: Jose Kelley is a 55 y.o. male who presents for annual physical exam.   His concerns today include:   HYPERTENSION: Doing well on current regimen. No side effects. No issues/concerns. Home blood pressures about the same today in office.  2. DIABETES TYPE 2: Doing well on current regimen. No side effects. No issues/concerns. Home blood sugars 110's on average.  3. HYPERLIPIDEMIA: Doing well on current regimen. No side effects. No issues/concerns.   Patient Active Problem List   Diagnosis Date Noted   Type 2 diabetes mellitus with hyperglycemia, without long-term current use of insulin ([place]) 01/14/2017   Hyperlipidemia 01/14/2017   Tobacco use disorder 01/14/2017   Essential hypertension 01/14/2017     Current Outpatient Medications on File Prior to Visit  Medication Sig Dispense Refill   blood glucose meter kit and supplies KIT Dispense based on patient and insurance preference. Use up to four times daily as directed. E11.65 1 each 0   glucose blood test strip Use as instructed 100 each 12   No current facility-administered medications on file prior to visit.    No Known Allergies  Social History   Socioeconomic History   Marital status: Married    Spouse name: Not on file   Number of children: Not on file   Years of education: Not on file   Highest education level: Not on file  Occupational History   Not on file  Tobacco Use   Smoking status: Every Day    Packs/day: 0.25    Years: 10.00    Pack years: 2.50    Types: Cigarettes    Last attempt to quit: 07/29/2020    Years since quitting: 0.8   Smokeless tobacco: Never  Vaping Use   Vaping Use: Never used  Substance and Sexual Activity   Alcohol use: Yes    Comment: Occasional beer   Drug use: No   Sexual activity: Not on file  Other Topics Concern   Not on file  Social History Narrative   Not  on file   Social Determinants of Health   Financial Resource Strain: Not on file  Food Insecurity: Not on file  Transportation Needs: Not on file  Physical Activity: Not on file  Stress: Not on file  Social Connections: Not on file  Intimate Partner Violence: Not on file    Family History  Problem Relation Age of Onset   Diabetes Mother    Stroke Mother    Cancer Brother    Pancreatic cancer Brother    Breast cancer Sister    Colon cancer Neg Hx    Esophageal cancer Neg Hx    Rectal cancer Neg Hx    Stomach cancer Neg Hx    Colon polyps Neg Hx     Past Surgical History:  Procedure Laterality Date   COLONOSCOPY     POLYPECTOMY     SHOULDER SURGERY Right     ROS: Review of Systems Negative except as stated above  PHYSICAL EXAM: BP 120/80 (BP Location: Left Arm, Patient Position: Sitting, Cuff Size: Normal)   Pulse 79   Temp 98.1 F (36.7 C)   Resp 16   Ht 5' 7.99" (1.727 m)   Wt 152 lb 9.6 oz (69.2 kg)   SpO2 97%   BMI 23.21 kg/m   Physical Exam HENT:     Head: Normocephalic and atraumatic.  Right Ear: Tympanic membrane, ear canal and external ear normal.     Left Ear: Tympanic membrane, ear canal and external ear normal.  Eyes:     Extraocular Movements: Extraocular movements intact.     Conjunctiva/sclera: Conjunctivae normal.     Pupils: Pupils are equal, round, and reactive to light.  Cardiovascular:     Rate and Rhythm: Normal rate and regular rhythm.     Pulses: Normal pulses.     Heart sounds: Normal heart sounds.  Pulmonary:     Effort: Pulmonary effort is normal.     Breath sounds: Normal breath sounds.  Abdominal:     General: Bowel sounds are normal.     Palpations: Abdomen is soft.  Genitourinary:    Comments: Patient declined exam. Musculoskeletal:        General: Normal range of motion.     Cervical back: Normal range of motion and neck supple.  Skin:    General: Skin is warm and dry.  Neurological:     General: No focal  deficit present.     Mental Status: He is alert and oriented to person, place, and time.  Psychiatric:        Mood and Affect: Mood normal.        Behavior: Behavior normal.   Diabetic foot exam was performed with the following findings:   No deformities, ulcerations, or other skin breakdown Normal sensation of 10g monofilament Intact posterior tibialis and dorsalis pedis pulses     ASSESSMENT AND PLAN: 1. Annual physical exam: - Counseled on 150 minutes of exercise per week as tolerated, healthy eating (including decreased daily intake of saturated fats, cholesterol, added sugars, sodium), STI prevention, and routine healthcare maintenance.  2. Screening for metabolic disorder: - EEF00+FHQR to check kidney function, liver function, and electrolyte balance.  - CMP14+EGFR  3. Screening for deficiency anemia: - CBC to screen for anemia. - CBC  4. Thyroid disorder screen: - TSH to check thyroid function.  - TSH  5. Colon cancer screening: - Per patient preference Cologuard for colon cancer screening. - Cologuard  6. Essential hypertension: - Continue Lisinopril-Hydrochlorothiazide as prescribed.  - Counseled on blood pressure goal of less than 130/80, low-sodium, DASH diet, medication compliance, 150 minutes of moderate intensity exercise per week as tolerated. Discussed medication compliance, adverse effects. - Follow-up with primary provider in 3 months or sooner if needed.  - lisinopril-hydrochlorothiazide (ZESTORETIC) 20-25 MG tablet; Take 1 tablet by mouth daily.  Dispense: 90 tablet; Refill: 0  7. Type 2 diabetes mellitus with hyperglycemia, without long-term current use of insulin ([place]): - Hemoglobin A1c today at goal at 6.7%, goal < 7%. This is the same as previous hemoglobin A1c of 6.7% on 10/15/2020. - Continue Metformin as prescribed.  - Discussed the importance of healthy eating habits, low-carbohydrate diet, low-sugar diet, regular aerobic exercise (at least 150  minutes a week as tolerated) and medication compliance to achieve or maintain control of diabetes. - To achieve an A1C goal of less than or equal to 7.0 percent, a fasting blood sugar of 80 to 130 mg/dL and a postprandial glucose (90 to 120 minutes after a meal) less than 180 mg/dL. In the event of sugars less than 60 mg/dl or greater than 400 mg/dl please notify the clinic ASAP. It is recommended that you undergo annual eye exams and annual foot exams. - Follow-up with primary provider in 3 months or sooner if needed.  - POCT glycosylated hemoglobin (Hb A1C) - metFORMIN (GLUCOPHAGE)  500 MG tablet; Take 2 tablets (1,000 mg total) by mouth daily with breakfast.  Dispense: 90 tablet; Refill: 0  8. Hyperlipidemia, unspecified hyperlipidemia type: -Practice low-fat heart healthy diet and at least 150 minutes of moderate intensity exercise weekly as tolerated.  - Continue Atorvastatin as prescribed.  - Lipid panel to screen level of cholesterol control. - Follow-up with primary provider as scheduled.  - Lipid panel - atorvastatin (LIPITOR) 40 MG tablet; Take 1.5 tablets (60 mg total) by mouth daily.  Dispense: 90 tablet; Refill: 0    Patient was given the opportunity to ask questions.  Patient verbalized understanding of the plan and was able to repeat key elements of the plan. Patient was given clear instructions to go to Emergency Department or return to medical center if symptoms don't improve, worsen, or new problems develop.The patient verbalized understanding.   Orders Placed This Encounter  Procedures   CBC   Lipid panel   TSH   CMP14+EGFR   Cologuard   POCT glycosylated hemoglobin (Hb A1C)     Requested Prescriptions   Signed Prescriptions Disp Refills   atorvastatin (LIPITOR) 40 MG tablet 90 tablet 0    Sig: Take 1.5 tablets (60 mg total) by mouth daily.   lisinopril-hydrochlorothiazide (ZESTORETIC) 20-25 MG tablet 90 tablet 0    Sig: Take 1 tablet by mouth daily.   metFORMIN  (GLUCOPHAGE) 500 MG tablet 90 tablet 0    Sig: Take 2 tablets (1,000 mg total) by mouth daily with breakfast.    Return in about 1 year (around 06/10/2022) for Physical per patient preference and 3 months hypertension and diabetes .  Camillia Herter, NP

## 2021-06-10 ENCOUNTER — Ambulatory Visit (INDEPENDENT_AMBULATORY_CARE_PROVIDER_SITE_OTHER): Payer: BC Managed Care – PPO | Admitting: Family

## 2021-06-10 ENCOUNTER — Other Ambulatory Visit: Payer: Self-pay

## 2021-06-10 VITALS — BP 120/80 | HR 79 | Temp 98.1°F | Resp 16 | Ht 67.99 in | Wt 152.6 lb

## 2021-06-10 DIAGNOSIS — E1165 Type 2 diabetes mellitus with hyperglycemia: Secondary | ICD-10-CM | POA: Diagnosis not present

## 2021-06-10 DIAGNOSIS — Z130 Encounter for screening for diseases of the blood and blood-forming organs and certain disorders involving the immune mechanism: Secondary | ICD-10-CM | POA: Diagnosis not present

## 2021-06-10 DIAGNOSIS — Z0000 Encounter for general adult medical examination without abnormal findings: Secondary | ICD-10-CM | POA: Diagnosis not present

## 2021-06-10 DIAGNOSIS — Z1329 Encounter for screening for other suspected endocrine disorder: Secondary | ICD-10-CM

## 2021-06-10 DIAGNOSIS — Z1211 Encounter for screening for malignant neoplasm of colon: Secondary | ICD-10-CM

## 2021-06-10 DIAGNOSIS — E785 Hyperlipidemia, unspecified: Secondary | ICD-10-CM | POA: Diagnosis not present

## 2021-06-10 DIAGNOSIS — Z13228 Encounter for screening for other metabolic disorders: Secondary | ICD-10-CM | POA: Diagnosis not present

## 2021-06-10 DIAGNOSIS — I10 Essential (primary) hypertension: Secondary | ICD-10-CM

## 2021-06-10 LAB — POCT GLYCOSYLATED HEMOGLOBIN (HGB A1C): Hemoglobin A1C: 6.7 % — AB (ref 4.0–5.6)

## 2021-06-10 MED ORDER — ATORVASTATIN CALCIUM 40 MG PO TABS: 60.0000 mg | ORAL_TABLET | Freq: Every day | ORAL | 0 refills | Status: DC

## 2021-06-10 MED ORDER — LISINOPRIL-HYDROCHLOROTHIAZIDE 20-25 MG PO TABS: 1.0000 | ORAL_TABLET | Freq: Every day | ORAL | 0 refills | Status: DC

## 2021-06-10 MED ORDER — METFORMIN HCL 500 MG PO TABS: 1000.0000 mg | ORAL_TABLET | Freq: Every day | ORAL | 0 refills | Status: DC

## 2021-06-10 NOTE — Patient Instructions (Signed)
Preventive Care 40-56 Years Old, Male Preventive care refers to lifestyle choices and visits with your health care provider that can promote health and wellness. This includes: A yearly physical exam. This is also called an annual wellness visit. Regular dental and eye exams. Immunizations. Screening for certain conditions. Healthy lifestyle choices, such as: Eating a healthy diet. Getting regular exercise. Not using drugs or products that contain nicotine and tobacco. Limiting alcohol use. What can I expect for my preventive care visit? Physical exam Your health care provider will check your: Height and weight. These may be used to calculate your BMI (body mass index). BMI is a measurement that tells if you are at a healthy weight. Heart rate and blood pressure. Body temperature. Skin for abnormal spots. Counseling Your health care provider may ask you questions about your: Past medical problems. Family's medical history. Alcohol, tobacco, and drug use. Emotional well-being. Home life and relationship well-being. Sexual activity. Diet, exercise, and sleep habits. Work and work environment. Access to firearms. What immunizations do I need? Vaccines are usually given at various ages, according to a schedule. Your health care provider will recommend vaccines for you based on your age, medical history, and lifestyle or other factors, such as travel or where you work. What tests do I need? Blood tests Lipid and cholesterol levels. These may be checked every 5 years, or more often if you are over 56 years old. Hepatitis C test. Hepatitis B test. Screening Lung cancer screening. You may have this screening every year starting at age 56 if you have a 30-pack-year history of smoking and currently smoke or have quit within the past 15 years. Prostate cancer screening. Recommendations will vary depending on your family history and other risks. Genital exam to check for testicular cancer  or hernias. Colorectal cancer screening. All adults should have this screening starting at age 56 and continuing until age 75. Your health care provider may recommend screening at age 56 if you are at increased risk. You will have tests every 1-10 years, depending on your results and the type of screening test. Diabetes screening. This is done by checking your blood sugar (glucose) after you have not eaten for a while (fasting). You may have this done every 1-3 years. STD (sexually transmitted disease) testing, if you are at risk. Follow these instructions at home: Eating and drinking  Eat a diet that includes fresh fruits and vegetables, whole grains, lean protein, and low-fat dairy products. Take vitamin and mineral supplements as recommended by your health care provider. Do not drink alcohol if your health care provider tells you not to drink. If you drink alcohol: Limit how much you have to 0-2 drinks a day. Be aware of how much alcohol is in your drink. In the [place] one drink equals one 12 oz bottle of beer (355 mL), one 5 oz glass of wine (148 mL), or one 1 oz glass of hard liquor (44 mL). Lifestyle Take daily care of your teeth and gums. Brush your teeth every morning and night with fluoride toothpaste. Floss one time each day. Stay active. Exercise for at least 30 minutes 5 or more days each week. Do not use any products that contain nicotine or tobacco, such as cigarettes, e-cigarettes, and chewing tobacco. If you need help quitting, ask your health care provider. Do not use drugs. If you are sexually active, practice safe sex. Use a condom or other form of protection to prevent STIs (sexually transmitted infections). If told by your   health care provider, take low-dose aspirin daily starting at age 56. Find healthy ways to cope with stress, such as: Meditation, yoga, or listening to music. Journaling. Talking to a trusted person. Spending time with friends and  family. Safety Always wear your seat belt while driving or riding in a vehicle. Do not drive: If you have been drinking alcohol. Do not ride with someone who has been drinking. When you are tired or distracted. While texting. Wear a helmet and other protective equipment during sports activities. If you have firearms in your house, make sure you follow all gun safety procedures. What's next? Go to your health care provider once a year for an annual wellness visit. Ask your health care provider how often you should have your eyes and teeth checked. Stay up to date on all vaccines. This information is not intended to replace advice given to you by your health care provider. Make sure you discuss any questions you have with your health care provider. Document Revised: 11/13/2020 Document Reviewed: 08/30/2018 Elsevier Patient Education  2022 Elsevier Inc.   

## 2021-06-10 NOTE — Progress Notes (Signed)
Pt presents for annual physical exam, pt need refills on metformin, atorvastatin, and lisinopril-hctz

## 2021-06-11 LAB — LIPID PANEL
Chol/HDL Ratio: 3.3 ratio (ref 0.0–5.0)
Cholesterol, Total: 137 mg/dL (ref 100–199)
HDL: 42 mg/dL (ref >39)
LDL Chol Calc (NIH): 80 mg/dL (ref 0–99)
Triglycerides: 76 mg/dL (ref 0–149)
VLDL Cholesterol Cal: 15 mg/dL (ref 5–40)

## 2021-06-11 LAB — CMP14+EGFR
ALT: 12 IU/L (ref 0–44)
AST: 18 IU/L (ref 0–40)
Albumin/Globulin Ratio: 1.8 (ref 1.2–2.2)
Albumin: 4.6 g/dL (ref 3.8–4.9)
Alkaline Phosphatase: 78 IU/L (ref 44–121)
BUN/Creatinine Ratio: 14 (ref 9–20)
BUN: 14 mg/dL (ref 6–24)
Bilirubin Total: 0.8 mg/dL (ref 0.0–1.2)
CO2: 23 mmol/L (ref 20–29)
Calcium: 10 mg/dL (ref 8.7–10.2)
Chloride: 101 mmol/L (ref 96–106)
Creatinine, Ser: 0.99 mg/dL (ref 0.76–1.27)
Globulin, Total: 2.5 g/dL (ref 1.5–4.5)
Glucose: 103 mg/dL — ABNORMAL HIGH (ref 65–99)
Potassium: 4.7 mmol/L (ref 3.5–5.2)
Sodium: 138 mmol/L (ref 134–144)
Total Protein: 7.1 g/dL (ref 6.0–8.5)
eGFR: 89 mL/min/{1.73_m2} (ref >59)

## 2021-06-11 LAB — CBC
Hematocrit: 48.8 % (ref 37.5–51.0)
Hemoglobin: 16 g/dL (ref 13.0–17.7)
MCH: 28.6 pg (ref 26.6–33.0)
MCHC: 32.8 g/dL (ref 31.5–35.7)
MCV: 87 fL (ref 79–97)
Platelets: 218 10*3/uL (ref 150–450)
RBC: 5.59 x10E6/uL (ref 4.14–5.80)
RDW: 13.9 % (ref 11.6–15.4)
WBC: 4.6 10*3/uL (ref 3.4–10.8)

## 2021-06-11 LAB — TSH: TSH: 0.833 u[IU]/mL (ref 0.450–4.500)

## 2021-06-11 NOTE — Progress Notes (Signed)
Kidney function normal.   Liver function normal.   Thyroid function normal.   Cholesterol normal. Continue Atorvastatin for high cholesterol.   No anemia.   Diabetes discussed in office.

## 2021-07-17 LAB — COLOGUARD: COLOGUARD: NEGATIVE

## 2021-07-18 NOTE — Progress Notes (Signed)
Cologuard negative. Repeat in 3 years or sooner if needed.

## 2021-08-21 IMAGING — DX DG CHEST 2V
2 series · 2 of 2 positions shown · non-contrast
Comparison: Chest radiographs 01/14/2017.

CLINICAL DATA: 55-year-old male with persistent shortness of breath
and cough following HU7HA-CI in [REDACTED].

EXAM:
CHEST - 2 VIEW

[chest pa]
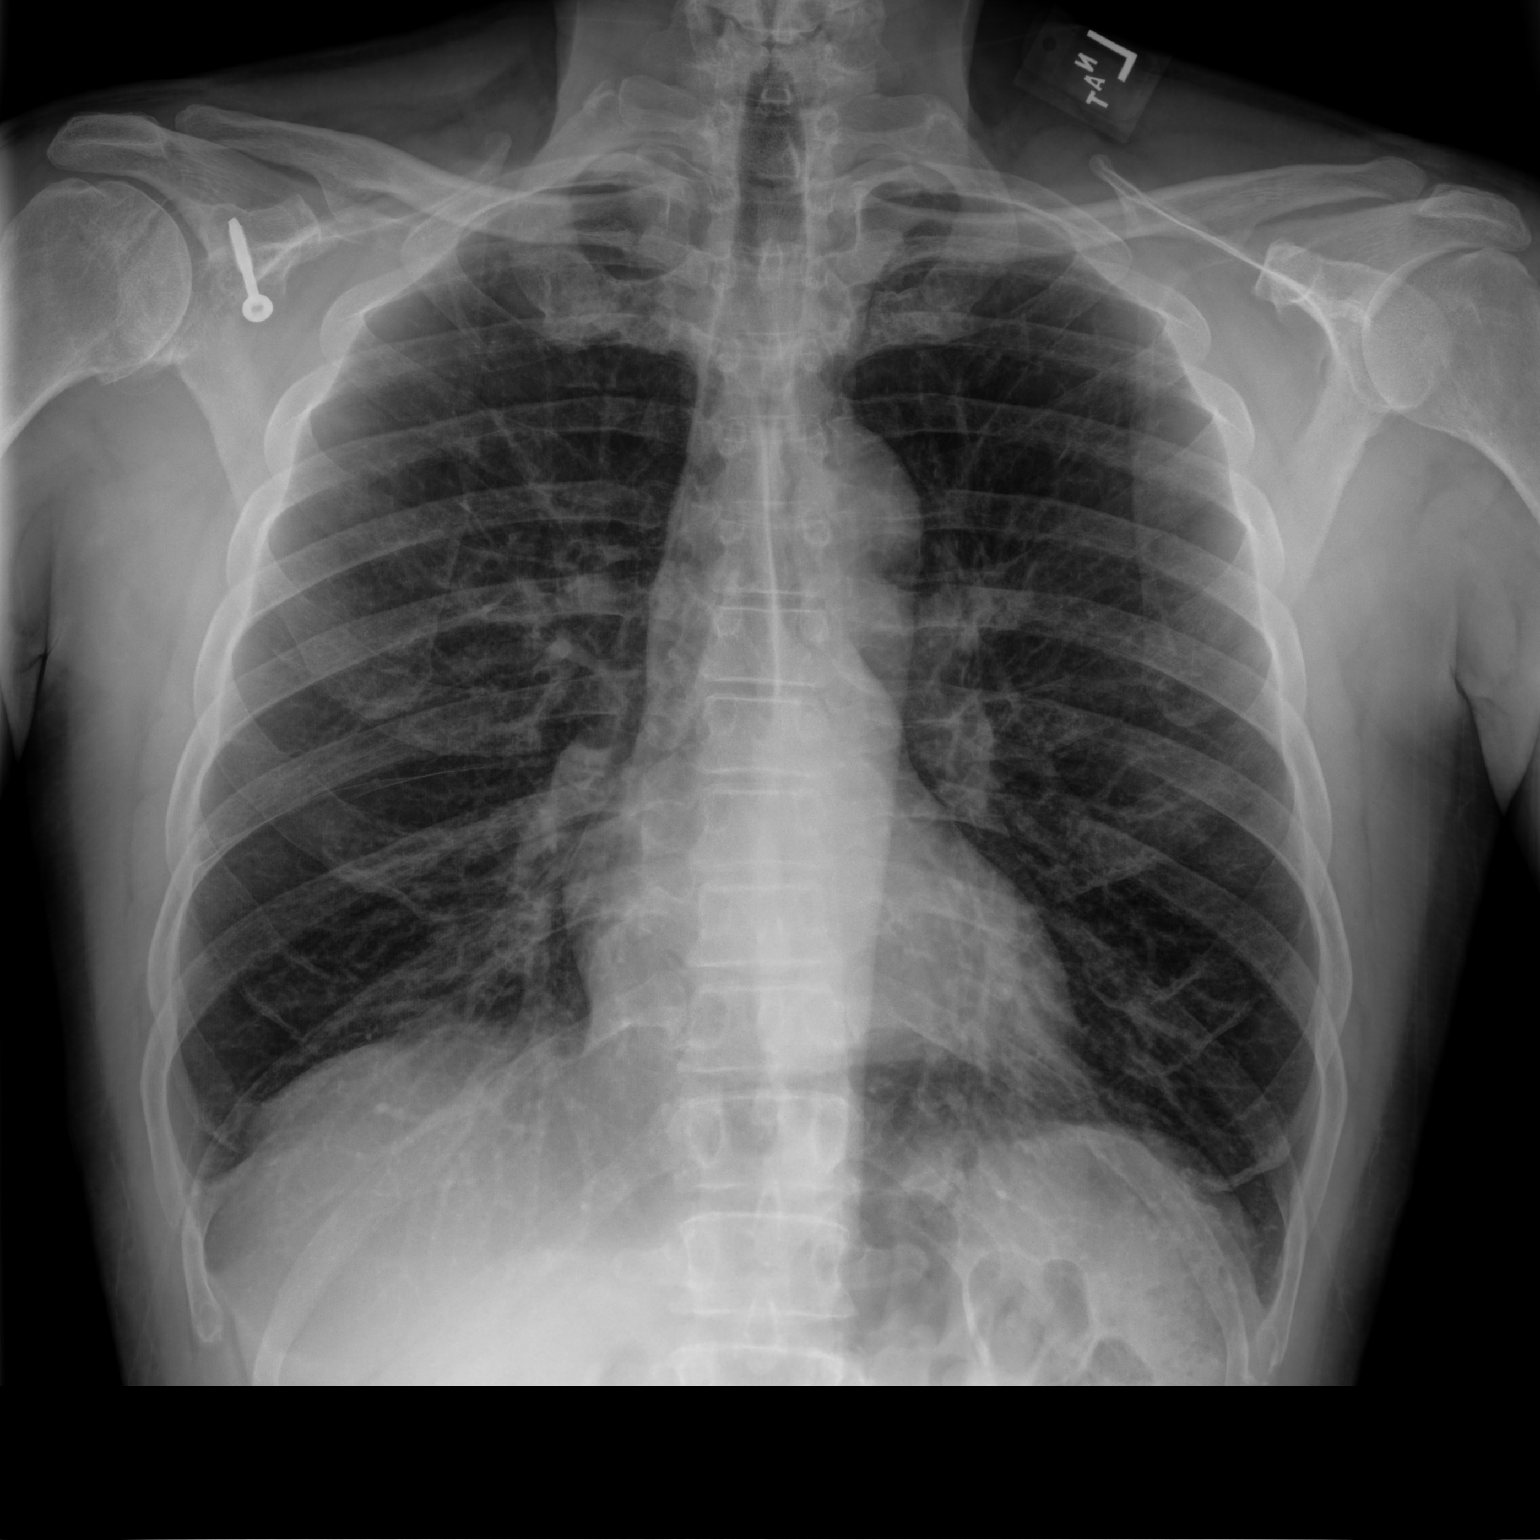

[chest lat]
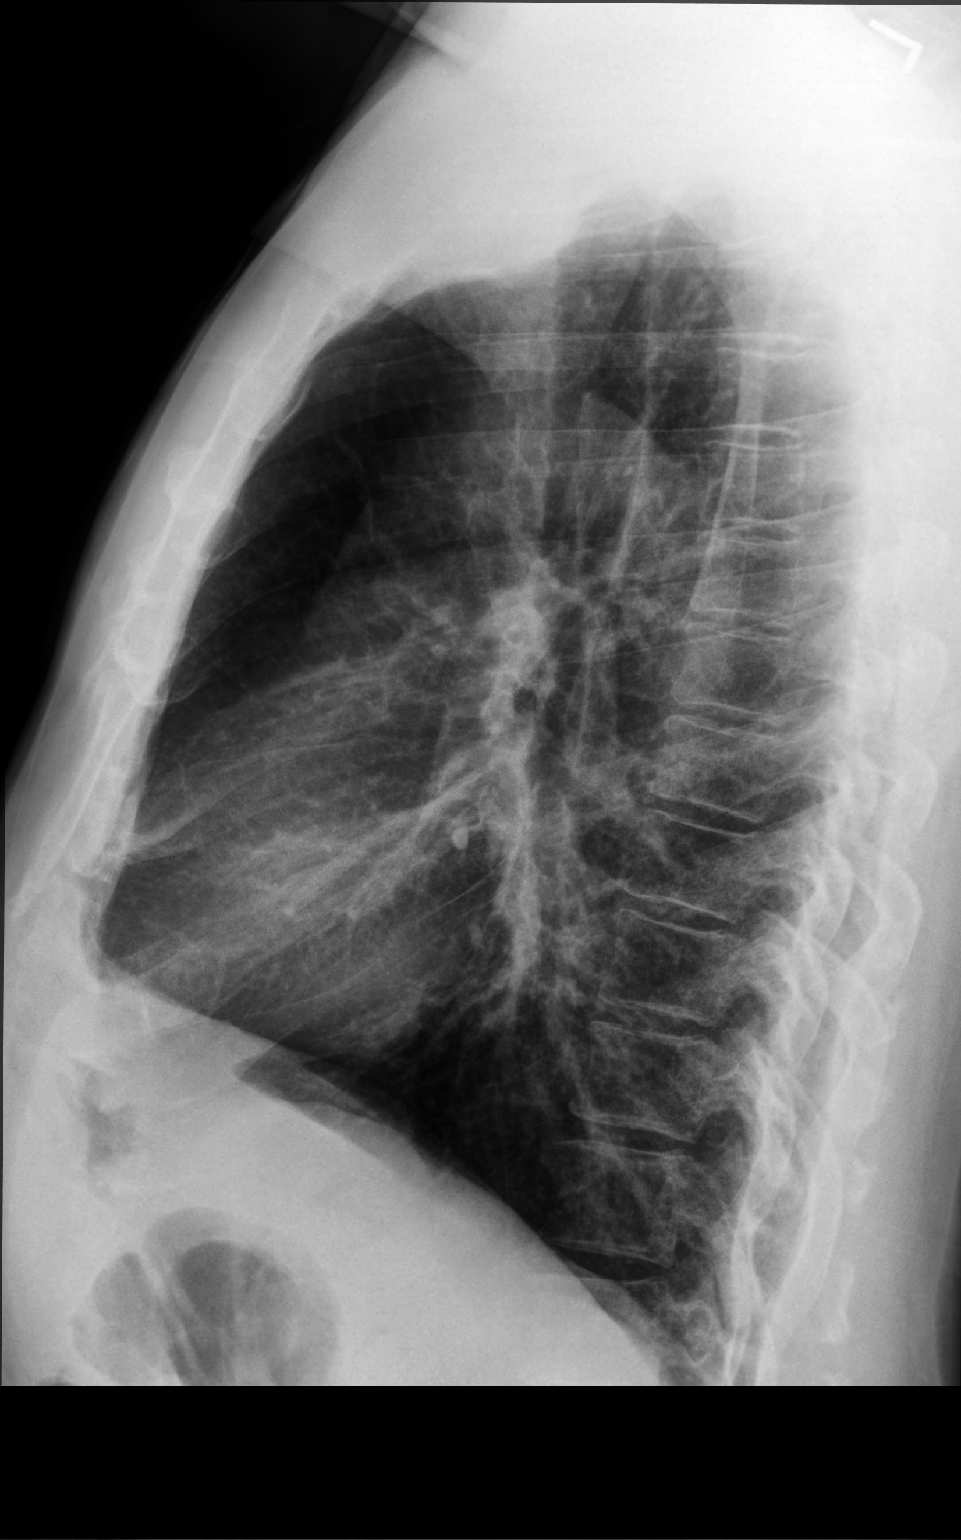

[2 of 2 positions shown; findings below may reference images not displayed]

FINDINGS: Lung volumes and mediastinal contours are stable since 6091 and
within normal limits. Visualized tracheal air column is within
normal limits. Lung markings appear stable since 6091, within normal
limits. No pneumonia, pneumothorax, pleural effusion or other
pulmonary abnormality identified. Stable visualized osseous
structures. Chronic postoperative changes at the right shoulder.
Negative visible bowel gas pattern.
IMPRESSION: Stable since 6091.  No cardiopulmonary abnormality identified.

## 2021-09-06 NOTE — Progress Notes (Signed)
Patient ID: Thoams Kelley, male    DOB: 1966-03-14  MRN: [id number]  CC: Hypertension Follow-Up  Subjective: Jose Kelley is a 55 y.o. male who presents for hypertension follow-up.   His concerns today include:   HYPERTENSION FOLLOW-UP: 06/10/2021: - Continue Lisinopril-Hydrochlorothiazide as prescribed.   09/14/2021: Doing well on current regimen. No side effects. No issues/concerns. Denies chest pain and shortness of breath.   2. DIABETES TYPE 2 FOLLOW-UP: 06/10/2021: - Continue Metformin as prescribed.   09/14/2021: Doing well on current regimen, no issues/concerns.  3. HYPERLIPIDEMIA FOLLOW-UP: Doing well on current regimen, no issues/concerns.  4. SINUS PRESSURE: Began Christmas Day. Endorses cough productive of phlegm and chest congestion. Denies shortness of breath, chest pain, and additional red flag symptoms. Doesn't recall being around anyone sick. Taking over-the-counter Theraflu and Robitussin.  Patient Active Problem List   Diagnosis Date Noted   Type 2 diabetes mellitus with hyperglycemia, without long-term current use of insulin ([place]) 01/14/2017   Hyperlipidemia 01/14/2017   Tobacco use disorder 01/14/2017   Essential hypertension 01/14/2017     Current Outpatient Medications on File Prior to Visit  Medication Sig Dispense Refill   blood glucose meter kit and supplies KIT Dispense based on patient and insurance preference. Use up to four times daily as directed. E11.65 1 each 0   glucose blood test strip Use as instructed 100 each 12   metFORMIN (GLUCOPHAGE) 500 MG tablet Take 2 tablets (1,000 mg total) by mouth daily with breakfast. 90 tablet 0   No current facility-administered medications on file prior to visit.    No Known Allergies  Social History   Socioeconomic History   Marital status: Married    Spouse name: Not on file   Number of children: Not on file   Years of education: Not on file   Highest education level: Not on file   Occupational History   Not on file  Tobacco Use   Smoking status: Every Day    Packs/day: 0.25    Years: 10.00    Pack years: 2.50    Types: Cigarettes    Last attempt to quit: 07/29/2020    Years since quitting: 1.1   Smokeless tobacco: Never  Vaping Use   Vaping Use: Never used  Substance and Sexual Activity   Alcohol use: Yes    Comment: Occasional beer   Drug use: No   Sexual activity: Not on file  Other Topics Concern   Not on file  Social History Narrative   Not on file   Social Determinants of Health   Financial Resource Strain: Not on file  Food Insecurity: Not on file  Transportation Needs: Not on file  Physical Activity: Not on file  Stress: Not on file  Social Connections: Not on file  Intimate Partner Violence: Not on file    Family History  Problem Relation Age of Onset   Diabetes Mother    Stroke Mother    Cancer Brother    Pancreatic cancer Brother    Breast cancer Sister    Colon cancer Neg Hx    Esophageal cancer Neg Hx    Rectal cancer Neg Hx    Stomach cancer Neg Hx    Colon polyps Neg Hx     Past Surgical History:  Procedure Laterality Date   COLONOSCOPY     POLYPECTOMY     SHOULDER SURGERY Right     ROS: Review of Systems Negative except as stated above  PHYSICAL EXAM: BP 127/85 (  BP Location: Left Arm, Patient Position: Sitting, Cuff Size: Normal)    Pulse 80    Temp 98.9 F (37.2 C)    Resp 18    Ht 5' 7.99" (1.727 m)    Wt 155 lb 12.8 oz (70.7 kg)    SpO2 96%    BMI 23.69 kg/m   Physical Exam HENT:     Head: Normocephalic and atraumatic.  Eyes:     Extraocular Movements: Extraocular movements intact.     Conjunctiva/sclera: Conjunctivae normal.     Pupils: Pupils are equal, round, and reactive to light.  Cardiovascular:     Rate and Rhythm: Normal rate and regular rhythm.     Pulses: Normal pulses.     Heart sounds: Normal heart sounds.  Pulmonary:     Effort: Pulmonary effort is normal.     Breath sounds: Normal  breath sounds.  Musculoskeletal:     Cervical back: Normal range of motion and neck supple.  Neurological:     General: No focal deficit present.     Mental Status: He is alert and oriented to person, place, and time.  Psychiatric:        Mood and Affect: Mood normal.        Behavior: Behavior normal.   ASSESSMENT AND PLAN: 1. Essential hypertension: - Continue Lisinopril-Hydrochlorothiazide as prescribed.  - Counseled on blood pressure goal of less than 130/80, low-sodium, DASH diet, medication compliance, 150 minutes of moderate intensity exercise per week as tolerated. Discussed medication compliance, adverse effects. - Follow-up with primary provider in 3 months or sooner if needed.  - lisinopril-hydrochlorothiazide (ZESTORETIC) 20-25 MG tablet; Take 1 tablet by mouth daily.  Dispense: 90 tablet; Refill: 0  2. Type 2 diabetes mellitus with hyperglycemia, without long-term current use of insulin ([place]): - No point-of-care hemoglobin A1c testing available today in office. Sending out to lab for evaluation. Will update plan of care and medication refills once lab results. - Hemoglobin A1c  3. Hyperlipidemia, unspecified hyperlipidemia type: - Practice low-fat heart healthy diet and at least 150 minutes of moderate intensity exercise weekly as tolerated.  - Continue Atorvastatin as prescribed.  - Follow-up with primary provider as scheduled.  - atorvastatin (LIPITOR) 40 MG tablet; Take 1.5 tablets (60 mg total) by mouth daily.  Dispense: 180 tablet; Refill: 0  4. Acute frontal sinusitis, recurrence not specified: - Begin Prednisone, Guaifenesin, and Amoxicillin-Clavulanate as prescribed.  - Screening respiratory panel for possible causes. - Follow-up with primary provider as scheduled. - COVID-19, Flu A+B and RSV - predniSONE (DELTASONE) 10 MG tablet; Take 6 tablets (60 mg total) by mouth daily with breakfast for 1 day, THEN 5 tablets (50 mg total) daily with breakfast for 1 day, THEN  4 tablets (40 mg total) daily with breakfast for 1 day, THEN 3 tablets (30 mg total) daily with breakfast for 1 day, THEN 2 tablets (20 mg total) daily with breakfast for 1 day, THEN 1 tablet (10 mg total) daily with breakfast for 1 day.  Dispense: 21 tablet; Refill: 0 - guaiFENesin 200 MG tablet; Take 1 tablet (200 mg total) by mouth every 6 (six) hours as needed for cough or to loosen phlegm.  Dispense: 30 tablet; Refill: 0 - amoxicillin-clavulanate (AUGMENTIN) 875-125 MG tablet; Take 1 tablet by mouth 2 (two) times daily for 7 days.  Dispense: 14 tablet; Refill: 0    Patient was given the opportunity to ask questions.  Patient verbalized understanding of the plan and was able to repeat  key elements of the plan. Patient was given clear instructions to go to Emergency Department or return to medical center if symptoms don't improve, worsen, or new problems develop.The patient verbalized understanding.   Orders Placed This Encounter  Procedures   COVID-19, Flu A+B and RSV   Hemoglobin A1c     Requested Prescriptions   Signed Prescriptions Disp Refills   lisinopril-hydrochlorothiazide (ZESTORETIC) 20-25 MG tablet 90 tablet 0    Sig: Take 1 tablet by mouth daily.   atorvastatin (LIPITOR) 40 MG tablet 180 tablet 0    Sig: Take 1.5 tablets (60 mg total) by mouth daily.   predniSONE (DELTASONE) 10 MG tablet 21 tablet 0    Sig: Take 6 tablets (60 mg total) by mouth daily with breakfast for 1 day, THEN 5 tablets (50 mg total) daily with breakfast for 1 day, THEN 4 tablets (40 mg total) daily with breakfast for 1 day, THEN 3 tablets (30 mg total) daily with breakfast for 1 day, THEN 2 tablets (20 mg total) daily with breakfast for 1 day, THEN 1 tablet (10 mg total) daily with breakfast for 1 day.   guaiFENesin 200 MG tablet 30 tablet 0    Sig: Take 1 tablet (200 mg total) by mouth every 6 (six) hours as needed for cough or to loosen phlegm.   amoxicillin-clavulanate (AUGMENTIN) 875-125 MG tablet  14 tablet 0    Sig: Take 1 tablet by mouth 2 (two) times daily for 7 days.    Return in about 3 months (around 12/13/2021) for Follow-Up or next available hypertension, diabetes.  Camillia Herter, NP

## 2021-09-14 ENCOUNTER — Ambulatory Visit (INDEPENDENT_AMBULATORY_CARE_PROVIDER_SITE_OTHER): Payer: No Typology Code available for payment source | Admitting: Family

## 2021-09-14 ENCOUNTER — Encounter: Payer: Self-pay | Admitting: Family

## 2021-09-14 ENCOUNTER — Other Ambulatory Visit: Payer: Self-pay

## 2021-09-14 VITALS — BP 127/85 | HR 80 | Temp 98.9°F | Resp 18 | Ht 67.99 in | Wt 155.8 lb

## 2021-09-14 DIAGNOSIS — J0110 Acute frontal sinusitis, unspecified: Secondary | ICD-10-CM

## 2021-09-14 DIAGNOSIS — E1165 Type 2 diabetes mellitus with hyperglycemia: Secondary | ICD-10-CM | POA: Diagnosis not present

## 2021-09-14 DIAGNOSIS — I10 Essential (primary) hypertension: Secondary | ICD-10-CM

## 2021-09-14 DIAGNOSIS — E785 Hyperlipidemia, unspecified: Secondary | ICD-10-CM

## 2021-09-14 MED ORDER — AMOXICILLIN-POT CLAVULANATE 875-125 MG PO TABS: 1.0000 | ORAL_TABLET | Freq: Two times a day (BID) | ORAL | 0 refills | Status: AC

## 2021-09-14 MED ORDER — PREDNISONE 10 MG PO TABS: ORAL_TABLET | ORAL | 0 refills | Status: AC

## 2021-09-14 MED ORDER — LISINOPRIL-HYDROCHLOROTHIAZIDE 20-25 MG PO TABS: 1.0000 | ORAL_TABLET | Freq: Every day | ORAL | 0 refills | Status: DC

## 2021-09-14 MED ORDER — ATORVASTATIN CALCIUM 40 MG PO TABS: 60.0000 mg | ORAL_TABLET | Freq: Every day | ORAL | 0 refills | Status: DC

## 2021-09-14 MED ORDER — GUAIFENESIN 200 MG PO TABS: 200.0000 mg | ORAL_TABLET | Freq: Four times a day (QID) | ORAL | 0 refills | Status: DC | PRN

## 2021-09-14 NOTE — Progress Notes (Signed)
Pt presents for hypertension and diabetes follow-up, pt states since Thursday been dealing with chest congestion and sinus pressure, productive cough w/phlegm Needs refills on lisinopril-hctz, metformin, and atorvastatin

## 2021-09-15 ENCOUNTER — Other Ambulatory Visit: Payer: Self-pay | Admitting: Family

## 2021-09-15 DIAGNOSIS — E1165 Type 2 diabetes mellitus with hyperglycemia: Secondary | ICD-10-CM

## 2021-09-15 LAB — COVID-19, FLU A+B AND RSV
Influenza A, NAA: NOT DETECTED
Influenza B, NAA: NOT DETECTED
RSV, NAA: NOT DETECTED
SARS-CoV-2, NAA: NOT DETECTED

## 2021-09-15 LAB — HEMOGLOBIN A1C
Est. average glucose Bld gHb Est-mCnc: 131 mg/dL
Hgb A1c MFr Bld: 6.2 % — ABNORMAL HIGH (ref 4.8–5.6)

## 2021-09-15 MED ORDER — METFORMIN HCL 500 MG PO TABS: 1000.0000 mg | ORAL_TABLET | Freq: Every day | ORAL | 0 refills | Status: DC

## 2021-09-15 NOTE — Progress Notes (Signed)
Diabetes at goal. Continue Metformin. Follow-up in 3 months.

## 2021-09-16 NOTE — Progress Notes (Signed)
Covid, Flu, RSV negative.

## 2021-10-28 NOTE — Telephone Encounter (Signed)
Erroneous encounter. Please disregard.

## 2021-12-08 NOTE — Progress Notes (Signed)
? ? ?Patient ID: Flay Ghosh, male    DOB: 1966/08/26  MRN: [id number] ? ?CC: Hypertension Follow-Up ? ?Subjective: ?Lang Zingg is a 56 y.o. male who presents for hypertension follow-up.  ? ?His concerns today include:  ?HYPERTENSION FOLLOW-UP: ?09/14/2021: ?- Continue Lisinopril-Hydrochlorothiazide as prescribed.  ? ?12/13/2021: ?Doing well on current regimen. No side effects. No issues/concerns. Denies chest pain, shortness of breath and additional red flag symptoms.  ? ?2. DIABETES TYPE 2 FOLLOW-UP: ?Doing well on current Metformin, no issues/concerns.  ? ?3. HYPERLIPIDEMIA FOLLOW-UP: ?Doing well on current Atorvastatin, no issues/concerns.  ? ?4. BACK SPASMS: ?Lower back. Intermittent. Denies recent trauma/injury, surgery and additional red flag symptoms. Taking Flexeril in the past helped.  ? ?Patient Active Problem List  ? Diagnosis Date Noted  ? Type 2 diabetes mellitus with hyperglycemia, without long-term current use of insulin ([place]) 01/14/2017  ? Hyperlipidemia 01/14/2017  ? Tobacco use disorder 01/14/2017  ? Essential hypertension 01/14/2017  ?  ? ?Current Outpatient Medications on File Prior to Visit  ?Medication Sig Dispense Refill  ? blood glucose meter kit and supplies KIT Dispense based on patient and insurance preference. Use up to four times daily as directed. E11.65 1 each 0  ? glucose blood test strip Use as instructed 100 each 12  ? ?No current facility-administered medications on file prior to visit.  ? ? ?No Known Allergies ? ?Social History  ? ?Socioeconomic History  ? Marital status: Married  ?  Spouse name: Not on file  ? Number of children: Not on file  ? Years of education: Not on file  ? Highest education level: Not on file  ?Occupational History  ? Not on file  ?Tobacco Use  ? Smoking status: Every Day  ?  Packs/day: 0.25  ?  Years: 10.00  ?  Pack years: 2.50  ?  Types: Cigarettes  ?  Last attempt to quit: 07/29/2020  ?  Years since quitting: 1.3  ? Smokeless tobacco: Never  ?Vaping  Use  ? Vaping Use: Never used  ?Substance and Sexual Activity  ? Alcohol use: Yes  ?  Comment: Occasional beer  ? Drug use: No  ? Sexual activity: Not on file  ?Other Topics Concern  ? Not on file  ?Social History Narrative  ? Not on file  ? ?Social Determinants of Health  ? ?Financial Resource Strain: Not on file  ?Food Insecurity: Not on file  ?Transportation Needs: Not on file  ?Physical Activity: Not on file  ?Stress: Not on file  ?Social Connections: Not on file  ?Intimate Partner Violence: Not on file  ? ? ?Family History  ?Problem Relation Age of Onset  ? Diabetes Mother   ? Stroke Mother   ? Cancer Brother   ? Pancreatic cancer Brother   ? Breast cancer Sister   ? Colon cancer Neg Hx   ? Esophageal cancer Neg Hx   ? Rectal cancer Neg Hx   ? Stomach cancer Neg Hx   ? Colon polyps Neg Hx   ? ? ?Past Surgical History:  ?Procedure Laterality Date  ? COLONOSCOPY    ? POLYPECTOMY    ? SHOULDER SURGERY Right   ? ? ?ROS: ?Review of Systems ?Negative except as stated above ? ?PHYSICAL EXAM: ?BP 129/80 (BP Location: Left Arm, Patient Position: Sitting, Cuff Size: Normal)   Pulse 87   Temp 98.3 ?F (36.8 ?C)   Resp 18   Ht 5' 7.99" (1.727 m)   Wt 155 lb (70.3 kg)  SpO2 96%   BMI 23.57 kg/m?  ? ?Physical Exam ?HENT:  ?   Head: Normocephalic.  ?Eyes:  ?   Extraocular Movements: Extraocular movements intact.  ?   Conjunctiva/sclera: Conjunctivae normal.  ?   Pupils: Pupils are equal, round, and reactive to light.  ?Cardiovascular:  ?   Rate and Rhythm: Normal rate and regular rhythm.  ?   Pulses: Normal pulses.  ?   Heart sounds: Normal heart sounds.  ?Pulmonary:  ?   Effort: Pulmonary effort is normal.  ?   Breath sounds: Normal breath sounds.  ?Musculoskeletal:  ?   Cervical back: Normal range of motion and neck supple.  ?Neurological:  ?   General: No focal deficit present.  ?   Mental Status: He is alert and oriented to person, place, and time.  ?Psychiatric:     ?   Mood and Affect: Mood normal.     ?    Behavior: Behavior normal.  ? ?Results for orders placed or performed in visit on 12/13/21  ?POCT glycosylated hemoglobin (Hb A1C)  ?Result Value Ref Range  ? Hemoglobin A1C 7.0 (A) 4.0 - 5.6 %  ? HbA1c POC (<> result, manual entry)    ? HbA1c, POC (prediabetic range)    ? HbA1c, POC (controlled diabetic range)    ? ? ?ASSESSMENT AND PLAN: ?1. Essential (primary) hypertension: ?- Continue Lisinopril-Hydrochlorothiazide as prescribed.  ?- Counseled on blood pressure goal of less than 130/80, low-sodium, DASH diet, medication compliance, and 150 minutes of moderate intensity exercise per week as tolerated. Counseled on medication adherence and adverse effects. ?- Update BMP.  ?- Follow-up with primary provider in 4 months or sooner if needed.  ?- Basic Metabolic Panel ?- lisinopril-hydrochlorothiazide (ZESTORETIC) 20-25 MG tablet; Take 1 tablet by mouth daily.  Dispense: 120 tablet; Refill: 0 ? ?2. Type 2 diabetes mellitus with hyperglycemia, without long-term current use of insulin ([place]): ?- Hemoglobin A1c today at goal at 7.0%.  ?- Continue Metformin as prescribed.  ?- Discussed the importance of healthy eating habits, low-carbohydrate diet, low-sugar diet, regular aerobic exercise (at least 150 minutes a week as tolerated) and medication compliance to achieve or maintain control of diabetes. ?- Follow-up with primary provider in 4 months or sooner if needed.  ?- POCT glycosylated hemoglobin (Hb A1C) ?- metFORMIN (GLUCOPHAGE) 500 MG tablet; Take 2 tablets (1,000 mg total) by mouth daily with breakfast.  Dispense: 240 tablet; Refill: 0 ? ?3. Hyperlipidemia, unspecified hyperlipidemia type: ?- Continue Atorvastatin as prescribed.  ?- Follow-up with primary provider as scheduled. ?- atorvastatin (LIPITOR) 40 MG tablet; Take 1.5 tablets (60 mg total) by mouth daily.  Dispense: 180 tablet; Refill: 0 ? ?4. Back spasm: ?- Cyclobenzaprine as prescribed. Counseled on medication adherence and adverse effects.  ?- Follow-up  with primary provider as scheduled. ?- cyclobenzaprine (FLEXERIL) 10 MG tablet; Take 1 tablet (10 mg total) by mouth at bedtime.  Dispense: 30 tablet; Refill: 0 ? ? ?Patient was given the opportunity to ask questions.  Patient verbalized understanding of the plan and was able to repeat key elements of the plan. Patient was given clear instructions to go to Emergency Department or return to medical center if symptoms don't improve, worsen, or new problems develop.The patient verbalized understanding. ? ? ?Orders Placed This Encounter  ?Procedures  ? Basic Metabolic Panel  ? POCT glycosylated hemoglobin (Hb A1C)  ? ? ? ?Requested Prescriptions  ? ?Signed Prescriptions Disp Refills  ? lisinopril-hydrochlorothiazide (ZESTORETIC) 20-25 MG tablet 120 tablet  0  ?  Sig: Take 1 tablet by mouth daily.  ? metFORMIN (GLUCOPHAGE) 500 MG tablet 240 tablet 0  ?  Sig: Take 2 tablets (1,000 mg total) by mouth daily with breakfast.  ? atorvastatin (LIPITOR) 40 MG tablet 180 tablet 0  ?  Sig: Take 1.5 tablets (60 mg total) by mouth daily.  ? cyclobenzaprine (FLEXERIL) 10 MG tablet 30 tablet 0  ?  Sig: Take 1 tablet (10 mg total) by mouth at bedtime.  ? ? ?Return in about 4 months (around 04/14/2022) for Follow-Up or next available hypertension, diabetes . ? ?Camillia Herter, NP  ?

## 2021-12-13 ENCOUNTER — Other Ambulatory Visit: Payer: Self-pay

## 2021-12-13 ENCOUNTER — Ambulatory Visit (INDEPENDENT_AMBULATORY_CARE_PROVIDER_SITE_OTHER): Payer: No Typology Code available for payment source | Admitting: Family

## 2021-12-13 VITALS — BP 129/80 | HR 87 | Temp 98.3°F | Resp 18 | Ht 67.99 in | Wt 155.0 lb

## 2021-12-13 DIAGNOSIS — E785 Hyperlipidemia, unspecified: Secondary | ICD-10-CM | POA: Diagnosis not present

## 2021-12-13 DIAGNOSIS — M62830 Muscle spasm of back: Secondary | ICD-10-CM | POA: Diagnosis not present

## 2021-12-13 DIAGNOSIS — E1165 Type 2 diabetes mellitus with hyperglycemia: Secondary | ICD-10-CM | POA: Diagnosis not present

## 2021-12-13 DIAGNOSIS — I10 Essential (primary) hypertension: Secondary | ICD-10-CM | POA: Diagnosis not present

## 2021-12-13 LAB — POCT GLYCOSYLATED HEMOGLOBIN (HGB A1C): Hemoglobin A1C: 7 % — AB (ref 4.0–5.6)

## 2021-12-13 MED ORDER — CYCLOBENZAPRINE HCL 10 MG PO TABS: 10.0000 mg | ORAL_TABLET | Freq: Every day | ORAL | 0 refills | Status: DC

## 2021-12-13 MED ORDER — ATORVASTATIN CALCIUM 40 MG PO TABS: 60.0000 mg | ORAL_TABLET | Freq: Every day | ORAL | 0 refills | Status: DC

## 2021-12-13 MED ORDER — METFORMIN HCL 500 MG PO TABS
1000.0000 mg | ORAL_TABLET | Freq: Every day | ORAL | 0 refills | Status: DC
Start: 2021-12-13 — End: 2022-02-18

## 2021-12-13 MED ORDER — LISINOPRIL-HYDROCHLOROTHIAZIDE 20-25 MG PO TABS: 1.0000 | ORAL_TABLET | Freq: Every day | ORAL | 0 refills | Status: DC

## 2021-12-13 NOTE — Progress Notes (Signed)
Pt presents for hypertension and diabetes follow-up ?Marland KitchenA1c 6.2 % 09/14/21 up to 7.0 %  ? ?Pt would like a muscle relaxer to help with on and off muscle spasms he has, pt stated he experienced spasm on yesterday  ?

## 2021-12-13 NOTE — Progress Notes (Signed)
Diabetes discussed in office.

## 2021-12-14 LAB — BASIC METABOLIC PANEL
BUN/Creatinine Ratio: 14 (ref 9–20)
BUN: 12 mg/dL (ref 6–24)
CO2: 23 mmol/L (ref 20–29)
Calcium: 9.9 mg/dL (ref 8.7–10.2)
Chloride: 102 mmol/L (ref 96–106)
Creatinine, Ser: 0.88 mg/dL (ref 0.76–1.27)
Glucose: 120 mg/dL — ABNORMAL HIGH (ref 70–99)
Potassium: 4.1 mmol/L (ref 3.5–5.2)
Sodium: 139 mmol/L (ref 134–144)
eGFR: 100 mL/min/{1.73_m2} (ref >59)

## 2021-12-14 NOTE — Progress Notes (Signed)
Kidney function and electrolytes normal.

## 2022-02-17 ENCOUNTER — Other Ambulatory Visit: Payer: Self-pay | Admitting: Family

## 2022-02-17 DIAGNOSIS — E785 Hyperlipidemia, unspecified: Secondary | ICD-10-CM

## 2022-02-17 DIAGNOSIS — E1165 Type 2 diabetes mellitus with hyperglycemia: Secondary | ICD-10-CM

## 2022-02-17 DIAGNOSIS — I10 Essential (primary) hypertension: Secondary | ICD-10-CM

## 2022-02-18 ENCOUNTER — Other Ambulatory Visit: Payer: Self-pay | Admitting: Family

## 2022-02-18 DIAGNOSIS — E1165 Type 2 diabetes mellitus with hyperglycemia: Secondary | ICD-10-CM

## 2022-02-18 DIAGNOSIS — E785 Hyperlipidemia, unspecified: Secondary | ICD-10-CM

## 2022-02-18 DIAGNOSIS — I10 Essential (primary) hypertension: Secondary | ICD-10-CM

## 2022-02-18 MED ORDER — METFORMIN HCL 500 MG PO TABS: 1000.0000 mg | ORAL_TABLET | Freq: Every day | ORAL | 0 refills | Status: DC

## 2022-02-18 MED ORDER — LISINOPRIL-HYDROCHLOROTHIAZIDE 20-25 MG PO TABS: 1.0000 | ORAL_TABLET | Freq: Every day | ORAL | 0 refills | Status: DC

## 2022-02-18 MED ORDER — ATORVASTATIN CALCIUM 40 MG PO TABS: 60.0000 mg | ORAL_TABLET | Freq: Every day | ORAL | 0 refills | Status: DC

## 2022-02-18 NOTE — Telephone Encounter (Signed)
Requested Prescriptions  Pending Prescriptions Disp Refills  . lisinopril-hydrochlorothiazide (ZESTORETIC) 20-25 MG tablet 120 tablet 0    Sig: Take 1 tablet by mouth daily.     Cardiovascular:  ACEI + Diuretic Combos Passed - 02/18/2022  9:57 AM      Passed - Na in normal range and within 180 days    Sodium  Date Value Ref Range Status  12/13/2021 139 134 - 144 mmol/L Final         Passed - K in normal range and within 180 days    Potassium  Date Value Ref Range Status  12/13/2021 4.1 3.5 - 5.2 mmol/L Final         Passed - Cr in normal range and within 180 days    Creatinine, Ser  Date Value Ref Range Status  12/13/2021 0.88 0.76 - 1.27 mg/dL Final         Passed - eGFR is 30 or above and within 180 days    GFR calc Af Amer  Date Value Ref Range Status  10/15/2020 105 >59 mL/min/1.73 Final    Comment:    **In accordance with recommendations from the NKF-ASN Task force,**   [facility] is in the process of updating its eGFR calculation to the   2021 CKD-EPI creatinine equation that estimates kidney function   without a race variable.    GFR calc non Af Amer  Date Value Ref Range Status  10/15/2020 91 >59 mL/min/1.73 Final   eGFR  Date Value Ref Range Status  12/13/2021 100 >59 mL/min/1.73 Final         Passed - Patient is not pregnant      Passed - Last BP in normal range    BP Readings from Last 1 Encounters:  12/13/21 129/80         Passed - Valid encounter within last 6 months    Recent Outpatient Visits          2 months ago Essential (primary) hypertension   Primary Care at [facility], Jose Kelley, Jose Kelley   5 months ago Essential hypertension   Primary Care at [facility], Jose Kelley, Jose Kelley   8 months ago Annual physical exam   Primary Care at [facility], Jose Kelley, Jose Kelley   10 months ago Encounter to establish care   Primary Care at [facility], Jose [place], Jose Kelley   1 year ago Cough   Primary Care at [place], Jose Quint, Jose Kelley       Future Appointments            In 1 month Jose Herter, Jose Kelley Primary Care at [facility]           . atorvastatin (LIPITOR) 40 MG tablet 180 tablet 0    Sig: Take 1.5 tablets (60 mg total) by mouth daily.     Cardiovascular:  Antilipid - Statins Failed - 02/18/2022  9:57 AM      Failed - Lipid Panel in normal range within the last 12 months    Cholesterol, Total  Date Value Ref Range Status  06/10/2021 137 100 - 199 mg/dL Final   LDL Chol Calc (NIH)  Date Value Ref Range Status  06/10/2021 80 0 - 99 mg/dL Final   HDL  Date Value Ref Range Status  06/10/2021 42 >39 mg/dL Final   Triglycerides  Date Value Ref Range Status  06/10/2021 76 0 - 149 mg/dL Final  Passed - Patient is not pregnant      Passed - Valid encounter within last 12 months    Recent Outpatient Visits          2 months ago Essential (primary) hypertension   Primary Care at [facility], Jose Kelley, Jose Kelley   5 months ago Essential hypertension   Primary Care at [facility], Jose Kelley, Jose Kelley   8 months ago Annual physical exam   Primary Care at [facility], Jose Kelley, Jose Kelley   10 months ago Encounter to establish care   Primary Care at [facility], Jose Hail[place], Jose Kelley   1 year ago Cough   Primary Care at [place], Jose Quint, Jose Kelley      Future Appointments            In 1 month Jose Herter, Jose Kelley Primary Care at [facility]           . metFORMIN (GLUCOPHAGE) 500 MG tablet 240 tablet 0    Sig: Take 2 tablets (1,000 mg total) by mouth daily with breakfast.     Endocrinology:  Diabetes - Biguanides Failed - 02/18/2022  9:57 AM      Failed - B12 Level in normal range and within 720 days    No results found for: VITAMINB12       Failed - CBC within normal limits and completed in the last 12 months    WBC  Date Value Ref Range Status  06/10/2021 4.6 3.4 - 10.8 x10E3/uL Final  01/14/2017 7.1 4.6 - 10.2 K/uL Final   RBC  Date Value Ref Range Status  06/10/2021 5.59  4.14 - 5.80 x10E6/uL Final  01/14/2017 5.54 4.69 - 6.13 M/uL Final   Hemoglobin  Date Value Ref Range Status  06/10/2021 16.0 13.0 - 17.7 g/dL Final   Hematocrit  Date Value Ref Range Status  06/10/2021 48.8 37.5 - 51.0 % Final   MCHC  Date Value Ref Range Status  06/10/2021 32.8 31.5 - 35.7 g/dL Final  01/14/2017 33.6 31.8 - 35.4 g/dL Final   [facility]  Date Value Ref Range Status  06/10/2021 28.6 26.6 - 33.0 pg Final   MCH, POC  Date Value Ref Range Status  01/14/2017 29.9 27 - 31.2 pg Final   MCV  Date Value Ref Range Status  06/10/2021 87 79 - 97 fL Final   Platelet Count, POC  Date Value Ref Range Status  01/14/2017 183 142 - 424 K/uL Final   RDW  Date Value Ref Range Status  06/10/2021 13.9 11.6 - 15.4 % Final   RDW, POC  Date Value Ref Range Status  01/14/2017 15.0 % Final         Passed - Cr in normal range and within 360 days    Creatinine, Ser  Date Value Ref Range Status  12/13/2021 0.88 0.76 - 1.27 mg/dL Final         Passed - HBA1C is between 0 and 7.9 and within 180 days    Hemoglobin A1C  Date Value Ref Range Status  12/13/2021 7.0 (A) 4.0 - 5.6 % Final   Hgb A1c MFr Bld  Date Value Ref Range Status  09/14/2021 6.2 (H) 4.8 - 5.6 % Final    Comment:             Prediabetes: 5.7 - 6.4          Diabetes: >6.4          Glycemic control  for adults with diabetes: <7.0          Passed - eGFR in normal range and within 360 days    GFR calc Af Amer  Date Value Ref Range Status  10/15/2020 105 >59 mL/min/1.73 Final    Comment:    **In accordance with recommendations from the NKF-ASN Task force,**   [facility] is in the process of updating its eGFR calculation to the   2021 CKD-EPI creatinine equation that estimates kidney function   without a race variable.    GFR calc non Af Amer  Date Value Ref Range Status  10/15/2020 91 >59 mL/min/1.73 Final   eGFR  Date Value Ref Range Status  12/13/2021 100 >59 mL/min/1.73 Final         Passed -  Valid encounter within last 6 months    Recent Outpatient Visits          2 months ago Essential (primary) hypertension   Primary Care at [facility], Jose Kelley, Jose Kelley   5 months ago Essential hypertension   Primary Care at [facility], Jose Kelley, Jose Kelley   8 months ago Annual physical exam   Primary Care at [facility], Jose Kelley, Jose Kelley   10 months ago Encounter to establish care   Primary Care at [facility] [place], Jose Kelley   1 year ago Cough   Primary Care at [place], Jose Quint, Jose Kelley      Future Appointments            In 1 month Jose Herter, Jose Kelley Primary Care at [facility]

## 2022-02-18 NOTE — Telephone Encounter (Signed)
Copied from [place] [id number]. Topic: General - Other >> Feb 18, 2022  9:25 AM Tessa Lerner A wrote: Reason for CRM: Medication Refill - Medication: lisinopril-hydrochlorothiazide (ZESTORETIC) 20-25 MG tablet [[id number]  atorvastatin (LIPITOR) 40 MG tablet [[id number]]   metFORMIN (GLUCOPHAGE) 500 MG tablet [[id number]]   Has the patient contacted their pharmacy? No.The patient has been unable to reach their pharmacy  (Agent: If no, request that the patient contact the pharmacy for the refill. If patient does not wish to contact the pharmacy document the reason why and proceed with request.) (Agent: If yes, when and what did the pharmacy advise?)  Preferred Pharmacy (with phone number or street name): [facility] ([address] [place] - [address] [place] ([place]) [place] [address] Phone: [phone] Fax: [phone] Hours: Not open 24 hours   Has the patient been seen for an appointment in the last year OR does the patient have an upcoming appointment? Yes.    Agent: Please be advised that RX refills may take up to 3 business days. We ask that you follow-up with your pharmacy.

## 2022-04-05 NOTE — Progress Notes (Signed)
Patient ID: Jose Kelley, male    DOB: 1966-03-23  MRN: [id number]  CC: Chronic Care Management   Subjective: Jose Kelley is a 56 y.o. male who presents for chronic care management.  His concerns today include:  Doing well on blood pressure, diabetes, and cholesterol medications without issues or concerns.    Patient Active Problem List   Diagnosis Date Noted   Type 2 diabetes mellitus with hyperglycemia, without long-term current use of insulin ([facility]) 01/14/2017   Hyperlipidemia 01/14/2017   Tobacco use disorder 01/14/2017   Essential hypertension 01/14/2017     Current Outpatient Medications on File Prior to Visit  Medication Sig Dispense Refill   atorvastatin (LIPITOR) 40 MG tablet Take 1.5 tablets (60 mg total) by mouth daily. 135 tablet 0   blood glucose meter kit and supplies KIT Dispense based on patient and insurance preference. Use up to four times daily as directed. E11.65 1 each 0   cyclobenzaprine (FLEXERIL) 10 MG tablet Take 1 tablet (10 mg total) by mouth at bedtime. 30 tablet 0   glucose blood test strip Use as instructed 100 each 12   lisinopril-hydrochlorothiazide (ZESTORETIC) 20-25 MG tablet Take 1 tablet by mouth daily. 90 tablet 0   metFORMIN (GLUCOPHAGE) 500 MG tablet Take 2 tablets (1,000 mg total) by mouth daily with breakfast. 180 tablet 0   No current facility-administered medications on file prior to visit.    No Known Allergies  Social History   Socioeconomic History   Marital status: Married    Spouse name: Not on file   Number of children: Not on file   Years of education: Not on file   Highest education level: Not on file  Occupational History   Not on file  Tobacco Use   Smoking status: Every Day    Packs/day: 0.25    Years: 10.00    Total pack years: 2.50    Types: Cigarettes    Last attempt to quit: 07/29/2020    Years since quitting: 1.7    Passive exposure: Current   Smokeless tobacco: Never  Vaping Use   Vaping Use: Never  used  Substance and Sexual Activity   Alcohol use: Yes    Comment: Occasional beer   Drug use: No   Sexual activity: Not on file  Other Topics Concern   Not on file  Social History Narrative   Not on file   Social Determinants of Health   Financial Resource Strain: Not on file  Food Insecurity: Not on file  Transportation Needs: Not on file  Physical Activity: Not on file  Stress: Not on file  Social Connections: Not on file  Intimate Partner Violence: Not on file    Family History  Problem Relation Age of Onset   Diabetes Mother    Stroke Mother    Cancer Brother    Pancreatic cancer Brother    Breast cancer Sister    Colon cancer Neg Hx    Esophageal cancer Neg Hx    Rectal cancer Neg Hx    Stomach cancer Neg Hx    Colon polyps Neg Hx     Past Surgical History:  Procedure Laterality Date   COLONOSCOPY     POLYPECTOMY     SHOULDER SURGERY Right     ROS: Review of Systems Negative except as stated above  PHYSICAL EXAM: BP 112/72 (BP Location: Left Arm, Patient Position: Sitting, Cuff Size: Normal)   Pulse 89   Temp 98.3 F (36.8 C)  Resp 16   Ht 5' 7.99" (1.727 m)   Wt 152 lb (68.9 kg)   SpO2 97%   BMI 23.12 kg/m   Physical Exam HENT:     Head: Normocephalic and atraumatic.  Eyes:     Extraocular Movements: Extraocular movements intact.     Conjunctiva/sclera: Conjunctivae normal.     Pupils: Pupils are equal, round, and reactive to light.  Cardiovascular:     Rate and Rhythm: Normal rate and regular rhythm.     Pulses: Normal pulses.     Heart sounds: Normal heart sounds.  Pulmonary:     Effort: Pulmonary effort is normal.     Breath sounds: Normal breath sounds.  Musculoskeletal:     Cervical back: Normal range of motion and neck supple.  Neurological:     General: No focal deficit present.     Mental Status: He is alert and oriented to person, place, and time.  Psychiatric:        Mood and Affect: Mood normal.        Behavior:  Behavior normal.    Results for orders placed or performed in visit on 04/15/22  POCT glycosylated hemoglobin (Hb A1C)  Result Value Ref Range   Hemoglobin A1C 6.7 (A) 4.0 - 5.6 %   HbA1c POC (<> result, manual entry)     HbA1c, POC (prediabetic range)     HbA1c, POC (controlled diabetic range)    .   ASSESSMENT AND PLAN: 1. Essential (primary) hypertension - Continue Lisinopril-Hydrochlorothiazide as prescribed. No refills needed as of present.  - Counseled on blood pressure goal of less than 130/80, low-sodium, DASH diet, medication compliance, and 150 minutes of moderate intensity exercise per week as tolerated. Counseled on medication adherence and adverse effects. - Follow-up with primary provider in 4 months or sooner if needed.   2. Type 2 diabetes mellitus with hyperglycemia, without long-term current use of insulin ([facility]) - Hemoglobin A1c at goal at 6.7%, goal < 7%. - Continue Metformin as prescribed. No refills needed as of present.  - Discussed the importance of healthy eating habits, low-carbohydrate diet, low-sugar diet, regular aerobic exercise (at least 150 minutes a week as tolerated) and medication compliance to achieve or maintain control of diabetes. - Follow-up with primary provider in 4 months or sooner if needed.  - POCT glycosylated hemoglobin (Hb A1C)  3. Hyperlipidemia, unspecified hyperlipidemia type - Continue Atorvastatin as prescribed. No re refills needed as of present.  - Follow-up with primary provider as scheduled.    Patient was given the opportunity to ask questions.  Patient verbalized understanding of the plan and was able to repeat key elements of the plan. Patient was given clear instructions to go to Emergency Department or return to medical center if symptoms don't improve, worsen, or new problems develop.The patient verbalized understanding.   Orders Placed This Encounter  Procedures   POCT glycosylated hemoglobin (Hb A1C)    Return in  about 4 months (around 08/16/2022) for Follow-Up or next available chronic care mgmt.  Camillia Herter, NP

## 2022-04-15 ENCOUNTER — Ambulatory Visit: Payer: No Typology Code available for payment source | Admitting: Family

## 2022-04-15 ENCOUNTER — Encounter: Payer: Self-pay | Admitting: Family

## 2022-04-15 VITALS — BP 112/72 | HR 89 | Temp 98.3°F | Resp 16 | Ht 67.99 in | Wt 152.0 lb

## 2022-04-15 DIAGNOSIS — I10 Essential (primary) hypertension: Secondary | ICD-10-CM | POA: Diagnosis not present

## 2022-04-15 DIAGNOSIS — E1165 Type 2 diabetes mellitus with hyperglycemia: Secondary | ICD-10-CM | POA: Diagnosis not present

## 2022-04-15 DIAGNOSIS — E785 Hyperlipidemia, unspecified: Secondary | ICD-10-CM

## 2022-04-15 LAB — POCT GLYCOSYLATED HEMOGLOBIN (HGB A1C): Hemoglobin A1C: 6.7 % — AB (ref 4.0–5.6)

## 2022-05-30 ENCOUNTER — Ambulatory Visit: Payer: Self-pay

## 2022-05-30 NOTE — Telephone Encounter (Signed)
Pt has head and chest congestion, requesting something other than what he has been taking OTC. It is not working, please advise  [facility] ([place]), [place]  [address] [place] ([place]) [place] [address]  Phone: [phone] Fax: [phone]    Chief Complaint: Productive cough with head congestion. OTC not helping, has tried Mucinex. Would like cough medication sent to his pharmacy. Symptoms: Mucus yellow to white Frequency: Saturday Pertinent Negatives: Patient denies fever Disposition: '[]'$ ED /'[]'$ Urgent Care (no appt availability in office) / '[]'$ Appointment(In office/virtual)/ '[]'$  [facility]/ '[]'$ Home Care/ '[]'$ Refused Recommended Disposition /'[]'$ [facility]/ '[x]'$  Follow-up with PCP Additional Notes: Please advise pt.  Answer Assessment - Initial Assessment Questions 1. ONSET: "When did the cough begin?"      Friday 2. SEVERITY: "How bad is the cough today?"      Severe 3. SPUTUM: "Describe the color of your sputum" (none, dry cough; clear, white, yellow, green)     White and yellow 4. HEMOPTYSIS: "Are you coughing up any blood?" If so ask: "How much?" (flecks, streaks, tablespoons, etc.)     No 5. DIFFICULTY BREATHING: "Are you having difficulty breathing?" If Yes, ask: "How bad is it?" (e.g., mild, moderate, severe)    - MILD: No SOB at rest, mild SOB with walking, speaks normally in sentences, can lie down, no retractions, pulse < 100.    - MODERATE: SOB at rest, SOB with minimal exertion and prefers to sit, cannot lie down flat, speaks in phrases, mild retractions, audible wheezing, pulse 100-120.    - SEVERE: Very SOB at rest, speaks in single words, struggling to breathe, sitting hunched forward, retractions, pulse > 120      No 6. FEVER: "Do you have a fever?" If Yes, ask: "What is your temperature, how was it measured, and when did it start?"     No 7. CARDIAC HISTORY: "Do you have any history of heart disease?" (e.g., heart  attack, congestive heart failure)      HTN 8. LUNG HISTORY: "Do you have any history of lung disease?"  (e.g., pulmonary embolus, asthma, emphysema)     No 9. PE RISK FACTORS: "Do you have a history of blood clots?" (or: recent major surgery, recent prolonged travel, bedridden)     No 10. OTHER SYMPTOMS: "Do you have any other symptoms?" (e.g., runny nose, wheezing, chest pain)       Runny nose 11. PREGNANCY: "Is there any chance you are pregnant?" "When was your last menstrual period?"       N/a 12. TRAVEL: "Have you traveled out of the country in the last month?" (e.g., travel history, exposures)       No  Protocols used: Cough - Acute Productive-A-AH

## 2022-05-30 NOTE — Telephone Encounter (Signed)
Pt would need to schedule an appt. No available appts at this time

## 2022-05-31 ENCOUNTER — Ambulatory Visit: Payer: No Typology Code available for payment source | Admitting: Physician Assistant

## 2022-05-31 ENCOUNTER — Encounter: Payer: Self-pay | Admitting: Physician Assistant

## 2022-05-31 DIAGNOSIS — J209 Acute bronchitis, unspecified: Secondary | ICD-10-CM

## 2022-05-31 MED ORDER — BENZONATATE 200 MG PO CAPS: 200.0000 mg | ORAL_CAPSULE | Freq: Two times a day (BID) | ORAL | 0 refills | Status: DC | PRN

## 2022-05-31 MED ORDER — PREDNISONE 20 MG PO TABS: ORAL_TABLET | ORAL | 0 refills | Status: AC

## 2022-05-31 NOTE — Patient Instructions (Signed)
To help with your congestion, you are going to do a steroid taper.  I also sent over Tessalon Perles that you can take twice a day to help with your cough.  I encourage you to make sure you are drinking lots of water, getting plenty of rest.  I hope that you feel better soon, please let us know if there is anything else we can do for you.  Kennieth Rad, PA-C Physician Assistant [facility] [email or web address]   Acute Bronchitis, Adult  Acute bronchitis is sudden inflammation of the main airways (bronchi) that come off the windpipe (trachea) in the lungs. The swelling causes the airways to get smaller and make more mucus than normal. This can make it hard to breathe and can cause coughing or noisy breathing (wheezing). Acute bronchitis may last several weeks. The cough may last longer. Allergies, asthma, and exposure to smoke may make the condition worse. What are the causes? This condition can be caused by germs and by substances that irritate the lungs, including: Cold and flu viruses. The most common cause of this condition is the virus that causes the common cold. Bacteria. This is less common. Breathing in substances that irritate the lungs, including: Smoke from cigarettes and other forms of tobacco. Dust and pollen. Fumes from household cleaning products, gases, or burned fuel. Indoor or outdoor air pollution. What increases the risk? The following factors may make you more likely to develop this condition: A weak body's defense system, also called the immune system. A condition that affects your lungs and breathing, such as asthma. What are the signs or symptoms? Common symptoms of this condition include: Coughing. This may bring up clear, yellow, or green mucus from your lungs (sputum). Wheezing. Runny or stuffy nose. Having too much mucus in your lungs (chest congestion). Shortness of breath. Aches and pains,  including sore throat or chest. How is this diagnosed? This condition is usually diagnosed based on: Your symptoms and medical history. A physical exam. You may also have other tests, including tests to rule out other conditions, such as pneumonia. These tests include: A test of lung function. Test of a mucus sample to look for the presence of bacteria. Tests to check the oxygen level in your blood. Blood tests. Chest X-ray. How is this treated? Most cases of acute bronchitis clear up over time without treatment. Your health care provider may recommend: Drinking more fluids to help thin your mucus so it is easier to cough up. Taking inhaled medicine (inhaler) to improve air flow in and out of your lungs. Using a vaporizer or a humidifier. These are machines that add water to the air to help you breathe better. Taking a medicine that thins mucus and clears congestion (expectorant). Taking a medicine that prevents or stops coughing (cough suppressant). It is not common to take an antibiotic medicine for this condition. Follow these instructions at home:  Take over-the-counter and prescription medicines only as told by your health care provider. Use an inhaler, vaporizer, or humidifier as told by your health care provider. Take two teaspoons (10 mL) of honey at bedtime to lessen coughing at night. Drink enough fluid to keep your urine pale yellow. Do not use any products that contain nicotine or tobacco. These products include cigarettes, chewing tobacco, and vaping devices, such as e-cigarettes. If you need help quitting, ask your health care provider. Get plenty of rest. Return to your normal activities as told by your health care provider. Ask your health  care provider what activities are safe for you. Keep all follow-up visits. This is important. How is this prevented? To lower your risk of getting this condition again: Wash your hands often with soap and water for at least 20  seconds. If soap and water are not available, use hand sanitizer. Avoid contact with people who have cold symptoms. Try not to touch your mouth, nose, or eyes with your hands. Avoid breathing in smoke or chemical fumes. Breathing smoke or chemical fumes will make your condition worse. Get the flu shot every year. Contact a health care provider if: Your symptoms do not improve after 2 weeks. You have trouble coughing up the mucus. Your cough keeps you awake at night. You have a fever. Get help right away if you: Cough up blood. Feel pain in your chest. Have severe shortness of breath. Faint or keep feeling like you are going to faint. Have a severe headache. Have a fever or chills that get worse. These symptoms may represent a serious problem that is an emergency. Do not wait to see if the symptoms will go away. Get medical help right away. Call your local emergency services (911 in the [place] Do not drive yourself to the hospital. Summary Acute bronchitis is inflammation of the main airways (bronchi) that come off the windpipe (trachea) in the lungs. The swelling causes the airways to get smaller and make more mucus than normal. Drinking more fluids can help thin your mucus so it is easier to cough up. Take over-the-counter and prescription medicines only as told by your health care provider. Do not use any products that contain nicotine or tobacco. These products include cigarettes, chewing tobacco, and vaping devices, such as e-cigarettes. If you need help quitting, ask your health care provider. Contact a health care provider if your symptoms do not improve after 2 weeks. This information is not intended to replace advice given to you by your health care provider. Make sure you discuss any questions you have with your health care provider. Document Revised: 12/16/2021 Document Reviewed: 01/06/2021 Elsevier Patient Education  [place].

## 2022-05-31 NOTE — Progress Notes (Addendum)
Virtual Visit Consent   Jose Kelley, you are scheduled for a virtual visit with a [facility] provider today. Just as with appointments in the office, your consent must be obtained to participate. Your consent will be active for this visit and any virtual visit you may have with one of our providers in the next 365 days. If you have a MyChart account, a copy of this consent can be sent to you electronically.  As this is a virtual visit,  technology does not allow for your provider to perform a traditional examination. This may limit your provider's ability to fully assess your condition. If your provider identifies any concerns that need to be evaluated in person or the need to arrange testing (such as labs, EKG, etc.), we will make arrangements to do so.   By engaging in this virtual visit, you consent to the provision of healthcare and authorize for your insurance to be billed (if applicable) for the services provided during this visit. Depending on your insurance coverage, you may receive a charge related to this service.  I need to obtain your verbal consent now. Are you willing to proceed with your visit today? Jose Kelley has provided verbal consent on 05/31/2022 for a virtual visit (telephone). Jose Rad, PA-C  Date: 05/31/2022 9:33 AM  Virtual Visit via Telephone Note   I, Loraine Grip Mayers, connected with  Jose Kelley  ([id number], 01-Nov-1965) on 05/31/22 at  9:40 AM EDT by a telemedicine application and verified that I am speaking with the correct person using two identifiers.  Location: Patient: Virtual Visit Location Patient: Mobile Provider: [facility] Medicine Unit    I discussed the limitations of evaluation and management by telemedicine and the availability of in person appointments. The patient expressed understanding and agreed to proceed.    History of Present Illness: Jose Kelley is a 56 y.o. states that he started having a sore throat, runny nose, and  productive cough with white and yellowish sputum on Friday 5 days ago.  States that his sore throat and runny nose has resolved, but the cough has lingered and is very bothersome.  States that he did take a home COVID test that was negative, denies sick contacts.  States that he has been using Mucinex without much relief.  States that he is eating and drinking okay.  States his last A1c last month was 6.7.    Is a daily smoker  Review of Systems  Constitutional:  Negative for chills and fever.  HENT:  Positive for congestion. Negative for ear pain, sinus pain and sore throat.   Eyes: Negative.   Respiratory:  Positive for cough and sputum production. Negative for shortness of breath and wheezing.   Cardiovascular:  Negative for chest pain.  Gastrointestinal:  Negative for abdominal pain, nausea and vomiting.  Genitourinary: Negative.   Musculoskeletal:  Negative for myalgias.  Skin: Negative.   Neurological:  Negative for headaches.  Endo/Heme/Allergies: Negative.   Psychiatric/Behavioral: Negative.      Problems:  Patient Active Problem List   Diagnosis Date Noted   Type 2 diabetes mellitus with hyperglycemia, without long-term current use of insulin ([place]) 01/14/2017   Hyperlipidemia 01/14/2017   Tobacco use disorder 01/14/2017   Essential hypertension 01/14/2017    Allergies: No Known Allergies Medications:  Current Outpatient Medications:    benzonatate (TESSALON) 200 MG capsule, Take 1 capsule (200 mg total) by mouth 2 (two) times daily as needed for cough., Disp: 20 capsule,  Rfl: 0   predniSONE (DELTASONE) 20 MG tablet, Take 3 tablets (60 mg total) by mouth daily with breakfast for 2 days, THEN 2 tablets (40 mg total) daily with breakfast for 2 days, THEN 1 tablet (20 mg total) daily with breakfast for 2 days, THEN 0.5 tablets (10 mg total) daily with breakfast for 2 days., Disp: 13 tablet, Rfl: 0   atorvastatin (LIPITOR) 40 MG tablet, Take 1.5 tablets (60 mg total) by mouth  daily., Disp: 135 tablet, Rfl: 0   blood glucose meter kit and supplies KIT, Dispense based on patient and insurance preference. Use up to four times daily as directed. E11.65, Disp: 1 each, Rfl: 0   cyclobenzaprine (FLEXERIL) 10 MG tablet, Take 1 tablet (10 mg total) by mouth at bedtime., Disp: 30 tablet, Rfl: 0   glucose blood test strip, Use as instructed, Disp: 100 each, Rfl: 12   lisinopril-hydrochlorothiazide (ZESTORETIC) 20-25 MG tablet, Take 1 tablet by mouth daily., Disp: 90 tablet, Rfl: 0   metFORMIN (GLUCOPHAGE) 500 MG tablet, Take 2 tablets (1,000 mg total) by mouth daily with breakfast., Disp: 180 tablet, Rfl: 0  Observations/Objective: Medical history and current medications reviewed, no physical exam completed     Assessment and Plan: 1. Acute bronchitis, unspecified organism - benzonatate (TESSALON) 200 MG capsule; Take 1 capsule (200 mg total) by mouth 2 (two) times daily as needed for cough.  Dispense: 20 capsule; Refill: 0 - predniSONE (DELTASONE) 20 MG tablet; Take 3 tablets (60 mg total) by mouth daily with breakfast for 2 days, THEN 2 tablets (40 mg total) daily with breakfast for 2 days, THEN 1 tablet (20 mg total) daily with breakfast for 2 days, THEN 0.5 tablets (10 mg total) daily with breakfast for 2 days.  Dispense: 13 tablet; Refill: 0  Patient education given on supportive care, red flags given for prompt reevaluation  Follow Up Instructions: I discussed the assessment and treatment plan with the patient. The patient was provided an opportunity to ask questions and all were answered. The patient agreed with the plan and demonstrated an understanding of the instructions.  A copy of instructions were sent to the patient via MyChart unless otherwise noted below.     The patient was advised to call back or seek an in-person evaluation if the symptoms worsen or if the condition fails to improve as anticipated.  Time:  I spent 12 minutes with the patient via  telehealth technology discussing the above problems/concerns.    Loraine Grip Mayers, PA-C

## 2022-08-16 NOTE — Progress Notes (Signed)
Patient ID: Jose Kelley, male    DOB: [birth year]  MRN: [id number]  CC: Chronic Care Management   Subjective: Jose Kelley is a 56 y.o. male who presents for chronic care management.   His concerns today include:  - Doing well on blood pressure medications, no issues/concerns. Denies red flag symptoms.  - Doing well on diabetes medication, no issues/concerns.  - Doing well on cholesterol medication, no issues/concerns.  - Nighttime nonproductive cough x 1.5 weeks. Reports prior to that he had a cold which he treated with over-the-counter medication. He is a current smoker. Denies red flag symptoms.  Patient Active Problem List   Diagnosis Date Noted   Type 2 diabetes mellitus with hyperglycemia, without long-term current use of insulin ([place]) 01/14/2017   Hyperlipidemia 01/14/2017   Tobacco use disorder 01/14/2017   Essential hypertension 01/14/2017     Current Outpatient Medications on File Prior to Visit  Medication Sig Dispense Refill   blood glucose meter kit and supplies KIT Dispense based on patient and insurance preference. Use up to four times daily as directed. E11.65 1 each 0   cyclobenzaprine (FLEXERIL) 10 MG tablet Take 1 tablet (10 mg total) by mouth at bedtime. 30 tablet 0   glucose blood test strip Use as instructed 100 each 12   No current facility-administered medications on file prior to visit.    No Known Allergies  Social History   Socioeconomic History   Marital status: Married    Spouse name: Not on file   Number of children: Not on file   Years of education: Not on file   Highest education level: Not on file  Occupational History   Not on file  Tobacco Use   Smoking status: Every Day    Packs/day: 0.25    Years: 10.00    Total pack years: 2.50    Types: Cigarettes    Last attempt to quit: 07/29/2020    Years since quitting: 2.0    Passive exposure: Current   Smokeless tobacco: Never  Vaping Use   Vaping Use: Never used  Substance and  Sexual Activity   Alcohol use: Yes    Comment: Occasional beer   Drug use: No   Sexual activity: Not on file  Other Topics Concern   Not on file  Social History Narrative   Not on file   Social Determinants of Health   Financial Resource Strain: Not on file  Food Insecurity: Not on file  Transportation Needs: Not on file  Physical Activity: Not on file  Stress: Not on file  Social Connections: Not on file  Intimate Partner Violence: Not on file    Family History  Problem Relation Age of Onset   Diabetes Mother    Stroke Mother    Cancer Brother    Pancreatic cancer Brother    Breast cancer Sister    Colon cancer Neg Hx    Esophageal cancer Neg Hx    Rectal cancer Neg Hx    Stomach cancer Neg Hx    Colon polyps Neg Hx     Past Surgical History:  Procedure Laterality Date   COLONOSCOPY     POLYPECTOMY     SHOULDER SURGERY Right     ROS: Review of Systems Negative except as stated above  PHYSICAL EXAM: BP 129/84 (BP Location: Left Arm, Patient Position: Sitting, Cuff Size: Normal)   Pulse 76   Temp 98.3 F (36.8 C)   Resp 16   Ht 5'  7.99" (1.727 m)   Wt 155 lb (70.3 kg)   SpO2 97%   BMI 23.57 kg/m   Physical Exam HENT:     Head: Normocephalic and atraumatic.  Eyes:     Extraocular Movements: Extraocular movements intact.     Conjunctiva/sclera: Conjunctivae normal.     Pupils: Pupils are equal, round, and reactive to light.  Cardiovascular:     Rate and Rhythm: Normal rate and regular rhythm.     Pulses: Normal pulses.     Heart sounds: Normal heart sounds.  Pulmonary:     Effort: Pulmonary effort is normal.     Breath sounds: Normal breath sounds.  Musculoskeletal:     Cervical back: Normal range of motion and neck supple.  Neurological:     General: No focal deficit present.     Mental Status: He is alert and oriented to person, place, and time.  Psychiatric:        Mood and Affect: Mood normal.        Behavior: Behavior normal.     Results for orders placed or performed in visit on 08/19/22  POCT glycosylated hemoglobin (Hb A1C)  Result Value Ref Range   Hemoglobin A1C 6.7 (A) 4.0 - 5.6 %   HbA1c POC (<> result, manual entry)     HbA1c, POC (prediabetic range)     HbA1c, POC (controlled diabetic range)      ASSESSMENT AND PLAN: 1. Primary hypertension - Continue Lisinopril-Hydrochlorothiazide as prescribed. - Counseled on blood pressure goal of less than 130/80, low-sodium, DASH diet, medication compliance, and 150 minutes of moderate intensity exercise per week as tolerated. Counseled on medication adherence and adverse effects. - Update BMP.  - Follow-up with primary provider in 3 months or sooner if needed.  - Basic Metabolic Panel - lisinopril-hydrochlorothiazide (ZESTORETIC) 20-25 MG tablet; Take 1 tablet by mouth daily.  Dispense: 30 tablet; Refill: 2  2. Type 2 diabetes mellitus with hyperglycemia, without long-term current use of insulin ([facility]) - Hemoglobin A1c at goal at 5.7%, goal 7%.  - Continue Metformin as prescribed.  - Discussed the importance of healthy eating habits, low-carbohydrate diet, low-sugar diet, regular aerobic exercise (at least 150 minutes a week as tolerated) and medication compliance to achieve or maintain control of diabetes. - Follow-up with primary provider in 3 months or sooner if needed.  - POCT glycosylated hemoglobin (Hb A1C) - metFORMIN (GLUCOPHAGE) 500 MG tablet; Take 2 tablets (1,000 mg total) by mouth daily with breakfast.  Dispense: 60 tablet; Refill: 2  3. Hyperlipidemia, unspecified hyperlipidemia type - Continue Atorvastatin as prescribed.  - Update lipid panel.  - Follow-up with primary provider as scheduled.  - Lipid panel - atorvastatin (LIPITOR) 40 MG tablet; Take 1.5 tablets (60 mg total) by mouth daily.  Dispense: 45 tablet; Refill: 2  4. Acute cough 5. History of bronchitis - Patient today in office with no cardiopulmonary distress.  - Prednisone,  Benzonatate, and Brompheniramine-Pseudoephedrine-DM as prescribed.  - Respiratory panel pending. - Follow-up with primary provider as scheduled.  - predniSONE (DELTASONE) 10 MG tablet; Take 6 tablets (60 mg total) by mouth daily with breakfast for 1 day, THEN 5 tablets (50 mg total) daily with breakfast for 1 day, THEN 4 tablets (40 mg total) daily with breakfast for 1 day, THEN 3 tablets (30 mg total) daily with breakfast for 1 day, THEN 2 tablets (20 mg total) daily with breakfast for 1 day, THEN 1 tablet (10 mg total) daily with breakfast for 1  day.  Dispense: 21 tablet; Refill: 0 - benzonatate (TESSALON) 200 MG capsule; Take 1 capsule (200 mg total) by mouth 2 (two) times daily as needed for cough.  Dispense: 30 capsule; Refill: 1 - brompheniramine-pseudoephedrine-DM 30-2-10 MG/5ML syrup; Take 5 mLs by mouth 4 (four) times daily as needed.  Dispense: 120 mL; Refill: 0 - COVID-19, Flu A+B and RSV   Patient was given the opportunity to ask questions.  Patient verbalized understanding of the plan and was able to repeat key elements of the plan. Patient was given clear instructions to go to Emergency Department or return to medical center if symptoms don't improve, worsen, or new problems develop.The patient verbalized understanding.   Orders Placed This Encounter  Procedures   COVID-19, Flu A+B and RSV   Basic Metabolic Panel   Lipid panel   POCT glycosylated hemoglobin (Hb A1C)     Requested Prescriptions   Signed Prescriptions Disp Refills   atorvastatin (LIPITOR) 40 MG tablet 45 tablet 2    Sig: Take 1.5 tablets (60 mg total) by mouth daily.   lisinopril-hydrochlorothiazide (ZESTORETIC) 20-25 MG tablet 30 tablet 2    Sig: Take 1 tablet by mouth daily.   metFORMIN (GLUCOPHAGE) 500 MG tablet 60 tablet 2    Sig: Take 2 tablets (1,000 mg total) by mouth daily with breakfast.   predniSONE (DELTASONE) 10 MG tablet 21 tablet 0    Sig: Take 6 tablets (60 mg total) by mouth daily with  breakfast for 1 day, THEN 5 tablets (50 mg total) daily with breakfast for 1 day, THEN 4 tablets (40 mg total) daily with breakfast for 1 day, THEN 3 tablets (30 mg total) daily with breakfast for 1 day, THEN 2 tablets (20 mg total) daily with breakfast for 1 day, THEN 1 tablet (10 mg total) daily with breakfast for 1 day.   benzonatate (TESSALON) 200 MG capsule 30 capsule 1    Sig: Take 1 capsule (200 mg total) by mouth 2 (two) times daily as needed for cough.   brompheniramine-pseudoephedrine-DM 30-2-10 MG/5ML syrup 120 mL 0    Sig: Take 5 mLs by mouth 4 (four) times daily as needed.    Return in about 3 months (around 11/18/2022) for Follow-Up or next available chronic care mgmt .  Camillia Herter, NP

## 2022-08-19 ENCOUNTER — Ambulatory Visit: Payer: No Typology Code available for payment source | Admitting: Family

## 2022-08-19 VITALS — BP 129/84 | HR 76 | Temp 98.3°F | Resp 16 | Ht 67.99 in | Wt 155.0 lb

## 2022-08-19 DIAGNOSIS — E1165 Type 2 diabetes mellitus with hyperglycemia: Secondary | ICD-10-CM | POA: Diagnosis not present

## 2022-08-19 DIAGNOSIS — I10 Essential (primary) hypertension: Secondary | ICD-10-CM | POA: Diagnosis not present

## 2022-08-19 DIAGNOSIS — R051 Acute cough: Secondary | ICD-10-CM

## 2022-08-19 DIAGNOSIS — E785 Hyperlipidemia, unspecified: Secondary | ICD-10-CM

## 2022-08-19 DIAGNOSIS — Z8709 Personal history of other diseases of the respiratory system: Secondary | ICD-10-CM

## 2022-08-19 LAB — POCT GLYCOSYLATED HEMOGLOBIN (HGB A1C): Hemoglobin A1C: 6.7 % — AB (ref 4.0–5.6)

## 2022-08-19 MED ORDER — PREDNISONE 10 MG PO TABS: ORAL_TABLET | ORAL | 0 refills | Status: AC

## 2022-08-19 MED ORDER — LISINOPRIL-HYDROCHLOROTHIAZIDE 20-25 MG PO TABS: 1.0000 | ORAL_TABLET | Freq: Every day | ORAL | 2 refills | Status: DC

## 2022-08-19 MED ORDER — BENZONATATE 200 MG PO CAPS: 200.0000 mg | ORAL_CAPSULE | Freq: Two times a day (BID) | ORAL | 1 refills | Status: DC | PRN

## 2022-08-19 MED ORDER — METFORMIN HCL 500 MG PO TABS: 1000.0000 mg | ORAL_TABLET | Freq: Every day | ORAL | 2 refills | Status: DC

## 2022-08-19 MED ORDER — ATORVASTATIN CALCIUM 40 MG PO TABS
60.0000 mg | ORAL_TABLET | Freq: Every day | ORAL | 2 refills | Status: DC
Start: 2022-08-19 — End: 2022-12-02

## 2022-08-19 MED ORDER — PSEUDOEPH-BROMPHEN-DM 30-2-10 MG/5ML PO SYRP: 5.0000 mL | ORAL_SOLUTION | Freq: Four times a day (QID) | ORAL | 0 refills | Status: DC | PRN

## 2022-08-19 NOTE — Progress Notes (Signed)
.  Pt presents for chronic care management   -cough a bedtime going on for week and half

## 2022-08-20 LAB — BASIC METABOLIC PANEL
BUN/Creatinine Ratio: 14 (ref 9–20)
BUN: 13 mg/dL (ref 6–24)
CO2: 24 mmol/L (ref 20–29)
Calcium: 10.1 mg/dL (ref 8.7–10.2)
Chloride: 100 mmol/L (ref 96–106)
Creatinine, Ser: 0.91 mg/dL (ref 0.76–1.27)
Glucose: 82 mg/dL (ref 70–99)
Potassium: 3.7 mmol/L (ref 3.5–5.2)
Sodium: 140 mmol/L (ref 134–144)
eGFR: 98 mL/min/{1.73_m2} (ref >59)

## 2022-08-20 LAB — LIPID PANEL
Chol/HDL Ratio: 3.6 ratio (ref 0.0–5.0)
Cholesterol, Total: 152 mg/dL (ref 100–199)
HDL: 42 mg/dL (ref >39)
LDL Chol Calc (NIH): 94 mg/dL (ref 0–99)
Triglycerides: 86 mg/dL (ref 0–149)
VLDL Cholesterol Cal: 16 mg/dL (ref 5–40)

## 2022-08-21 LAB — COVID-19, FLU A+B AND RSV
Influenza A, NAA: NOT DETECTED
Influenza B, NAA: NOT DETECTED
RSV, NAA: NOT DETECTED
SARS-CoV-2, NAA: NOT DETECTED

## 2022-11-18 ENCOUNTER — Ambulatory Visit: Payer: No Typology Code available for payment source | Admitting: Family

## 2022-11-30 NOTE — Progress Notes (Signed)
Patient ID: Jose Kelley, male    DOB: 06-16-1966  MRN: [id number]  CC: Chronic Care Management  Subjective: Jose Kelley is a 57 y.o. male who presents for chronic care management.   His concerns today include:  Doing well on chronic care medications, no issues/concerns. He denies red flag symptoms such as but not limited to chest pain, shortness of breath, worst headache of life, nausea/vomiting. He is checking his blood pressures outside of office and similar to today's reading. No further issues/concerns for discussion today.   Patient Active Problem List   Diagnosis Date Noted   Type 2 diabetes mellitus with hyperglycemia, without long-term current use of insulin ([place]) 01/14/2017   Hyperlipidemia 01/14/2017   Tobacco use disorder 01/14/2017   Essential hypertension 01/14/2017     Current Outpatient Medications on File Prior to Visit  Medication Sig Dispense Refill   blood glucose meter kit and supplies KIT Dispense based on patient and insurance preference. Use up to four times daily as directed. E11.65 1 each 0   cyclobenzaprine (FLEXERIL) 10 MG tablet Take 1 tablet (10 mg total) by mouth at bedtime. 30 tablet 0   glucose blood test strip Use as instructed 100 each 12   No current facility-administered medications on file prior to visit.    No Known Allergies  Social History   Socioeconomic History   Marital status: Married    Spouse name: Not on file   Number of children: Not on file   Years of education: Not on file   Highest education level: Not on file  Occupational History   Not on file  Tobacco Use   Smoking status: Every Day    Packs/day: 0.25    Years: 10.00    Additional pack years: 0.00    Total pack years: 2.50    Types: Cigarettes    Last attempt to quit: 07/29/2020    Years since quitting: 2.3    Passive exposure: Current   Smokeless tobacco: Never  Vaping Use   Vaping Use: Never used  Substance and Sexual Activity   Alcohol use: Yes     Comment: Occasional beer   Drug use: No   Sexual activity: Not on file  Other Topics Concern   Not on file  Social History Narrative   Not on file   Social Determinants of Health   Financial Resource Strain: Not on file  Food Insecurity: Not on file  Transportation Needs: Not on file  Physical Activity: Not on file  Stress: Not on file  Social Connections: Not on file  Intimate Partner Violence: Not on file    Family History  Problem Relation Age of Onset   Diabetes Mother    Stroke Mother    Cancer Brother    Pancreatic cancer Brother    Breast cancer Sister    Colon cancer Neg Hx    Esophageal cancer Neg Hx    Rectal cancer Neg Hx    Stomach cancer Neg Hx    Colon polyps Neg Hx     Past Surgical History:  Procedure Laterality Date   COLONOSCOPY     POLYPECTOMY     SHOULDER SURGERY Right     ROS: Review of Systems Negative except as stated above  PHYSICAL EXAM: BP 133/83 (BP Location: Left Arm, Patient Position: Sitting, Cuff Size: Normal)   Pulse 77   Temp 98.3 F (36.8 C)   Resp 16   Ht 5' 7.99" (1.727 m)   Wt  148 lb (67.1 kg)   SpO2 98%   BMI 22.51 kg/m   Physical Exam HENT:     Head: Normocephalic and atraumatic.  Eyes:     Extraocular Movements: Extraocular movements intact.     Conjunctiva/sclera: Conjunctivae normal.     Pupils: Pupils are equal, round, and reactive to light.  Cardiovascular:     Rate and Rhythm: Normal rate and regular rhythm.     Pulses: Normal pulses.     Heart sounds: Normal heart sounds.  Pulmonary:     Effort: Pulmonary effort is normal.     Breath sounds: Normal breath sounds.  Musculoskeletal:     Cervical back: Normal range of motion and neck supple.  Neurological:     General: No focal deficit present.     Mental Status: He is alert and oriented to person, place, and time.  Psychiatric:        Mood and Affect: Mood normal.        Behavior: Behavior normal.     Results for orders placed or performed in  visit on 12/02/22  POCT glycosylated hemoglobin (Hb A1C)  Result Value Ref Range   Hemoglobin A1C     HbA1c POC (<> result, manual entry)     HbA1c, POC (prediabetic range)     HbA1c, POC (controlled diabetic range) 6.7 0.0 - 7.0 %    ASSESSMENT AND PLAN: 1. Primary hypertension - Continue Lisinopril-Hydrochlorothiazide as prescribed.  - Counseled on blood pressure goal of less than 130/80, low-sodium, DASH diet, medication compliance, and 150 minutes of moderate intensity exercise per week as tolerated. Counseled on medication adherence and adverse effects. - Follow-up with primary provider in 6 months or sooner if needed.  - lisinopril-hydrochlorothiazide (ZESTORETIC) 20-25 MG tablet; Take 1 tablet by mouth daily.  Dispense: 30 tablet; Refill: 2  2. Type 2 diabetes mellitus with hyperglycemia, without long-term current use of insulin (HCC) - Hemoglobin A1c at goal at 6.7%, goal 7%.  - Continue Metformin as prescribed. - Discussed the importance of healthy eating habits, low-carbohydrate diet, low-sugar diet, regular aerobic exercise (at least 150 minutes a week as tolerated) and medication compliance to achieve or maintain control of diabetes. - Follow-up with primary provider in 6 months or sooner if needed.  - POCT glycosylated hemoglobin (Hb A1C) - metFORMIN (GLUCOPHAGE) 500 MG tablet; Take 2 tablets (1,000 mg total) by mouth daily with breakfast.  Dispense: 60 tablet; Refill: 2  3. Hyperlipidemia, unspecified hyperlipidemia type - Continue Atorvastatin as prescribed.  - Follow-up with primary provider in 6 months or sooner if needed.  - atorvastatin (LIPITOR) 40 MG tablet; Take 1.5 tablets (60 mg total) by mouth daily.  Dispense: 45 tablet; Refill: 2   Patient was given the opportunity to ask questions.  Patient verbalized understanding of the plan and was able to repeat key elements of the plan. Patient was given clear instructions to go to Emergency Department or return to  medical center if symptoms don't improve, worsen, or new problems develop.The patient verbalized understanding.   Orders Placed This Encounter  Procedures   POCT glycosylated hemoglobin (Hb A1C)    Requested Prescriptions   Signed Prescriptions Disp Refills   atorvastatin (LIPITOR) 40 MG tablet 45 tablet 2    Sig: Take 1.5 tablets (60 mg total) by mouth daily.   lisinopril-hydrochlorothiazide (ZESTORETIC) 20-25 MG tablet 30 tablet 2    Sig: Take 1 tablet by mouth daily.   metFORMIN (GLUCOPHAGE) 500 MG tablet 60 tablet 2  Sig: Take 2 tablets (1,000 mg total) by mouth daily with breakfast.    Return in about 6 months (around 06/04/2023) for Follow-Up or next available chronic care mgmt .  Camillia Herter, NP

## 2022-12-02 ENCOUNTER — Encounter: Payer: Self-pay | Admitting: Family

## 2022-12-02 ENCOUNTER — Ambulatory Visit: Payer: No Typology Code available for payment source | Admitting: Family

## 2022-12-02 VITALS — BP 133/83 | HR 77 | Temp 98.3°F | Resp 16 | Ht 67.99 in | Wt 148.0 lb

## 2022-12-02 DIAGNOSIS — E1165 Type 2 diabetes mellitus with hyperglycemia: Secondary | ICD-10-CM | POA: Diagnosis not present

## 2022-12-02 DIAGNOSIS — E785 Hyperlipidemia, unspecified: Secondary | ICD-10-CM

## 2022-12-02 DIAGNOSIS — I10 Essential (primary) hypertension: Secondary | ICD-10-CM

## 2022-12-02 LAB — POCT GLYCOSYLATED HEMOGLOBIN (HGB A1C): HbA1c, POC (controlled diabetic range): 6.7 % (ref 0.0–7.0)

## 2022-12-02 MED ORDER — ATORVASTATIN CALCIUM 40 MG PO TABS: 60.0000 mg | ORAL_TABLET | Freq: Every day | ORAL | 2 refills | Status: DC

## 2022-12-02 MED ORDER — LISINOPRIL-HYDROCHLOROTHIAZIDE 20-25 MG PO TABS: 1.0000 | ORAL_TABLET | Freq: Every day | ORAL | 2 refills | Status: DC

## 2022-12-02 MED ORDER — METFORMIN HCL 500 MG PO TABS: 1000.0000 mg | ORAL_TABLET | Freq: Every day | ORAL | 2 refills | Status: DC

## 2022-12-02 NOTE — Progress Notes (Signed)
.  Pt presents for chronic care management   

## 2023-05-03 ENCOUNTER — Other Ambulatory Visit: Payer: Self-pay | Admitting: Family

## 2023-05-03 DIAGNOSIS — I10 Essential (primary) hypertension: Secondary | ICD-10-CM

## 2023-05-03 DIAGNOSIS — E1165 Type 2 diabetes mellitus with hyperglycemia: Secondary | ICD-10-CM

## 2023-05-03 DIAGNOSIS — E785 Hyperlipidemia, unspecified: Secondary | ICD-10-CM

## 2023-05-04 ENCOUNTER — Other Ambulatory Visit: Payer: Self-pay

## 2023-05-04 DIAGNOSIS — E785 Hyperlipidemia, unspecified: Secondary | ICD-10-CM

## 2023-05-04 DIAGNOSIS — E1165 Type 2 diabetes mellitus with hyperglycemia: Secondary | ICD-10-CM

## 2023-05-04 DIAGNOSIS — I10 Essential (primary) hypertension: Secondary | ICD-10-CM

## 2023-05-04 MED ORDER — ATORVASTATIN CALCIUM 40 MG PO TABS: 60.0000 mg | ORAL_TABLET | Freq: Every day | ORAL | 2 refills | Status: DC

## 2023-05-04 MED ORDER — METFORMIN HCL 500 MG PO TABS
1000.0000 mg | ORAL_TABLET | Freq: Every day | ORAL | 2 refills | Status: DC
Start: 2023-05-04 — End: 2023-07-21

## 2023-05-04 MED ORDER — LISINOPRIL-HYDROCHLOROTHIAZIDE 20-25 MG PO TABS
1.0000 | ORAL_TABLET | Freq: Every day | ORAL | 0 refills | Status: DC
Start: 2023-05-04 — End: 2023-07-21

## 2023-05-04 MED ORDER — ATORVASTATIN CALCIUM 40 MG PO TABS: 60.0000 mg | ORAL_TABLET | Freq: Every day | ORAL | 0 refills | Status: DC

## 2023-05-04 MED ORDER — LISINOPRIL-HYDROCHLOROTHIAZIDE 20-25 MG PO TABS
1.0000 | ORAL_TABLET | Freq: Every day | ORAL | 2 refills | Status: AC
Start: 2023-05-04 — End: 2023-08-02

## 2023-05-04 MED ORDER — METFORMIN HCL 500 MG PO TABS
1000.0000 mg | ORAL_TABLET | Freq: Every day | ORAL | 0 refills | Status: AC
Start: 2023-05-04 — End: 2023-08-02

## 2023-05-04 NOTE — Telephone Encounter (Signed)
Will refill for 30 days, patient needs appointment for additional refills

## 2023-06-16 ENCOUNTER — Ambulatory Visit: Payer: No Typology Code available for payment source | Admitting: Family

## 2023-06-16 ENCOUNTER — Encounter: Payer: Self-pay | Admitting: Family

## 2023-06-16 VITALS — BP 127/76 | HR 66 | Temp 98.2°F | Ht 67.75 in | Wt 166.2 lb

## 2023-06-16 DIAGNOSIS — E119 Type 2 diabetes mellitus without complications: Secondary | ICD-10-CM | POA: Diagnosis not present

## 2023-06-16 DIAGNOSIS — Z23 Encounter for immunization: Secondary | ICD-10-CM

## 2023-06-16 DIAGNOSIS — Z0100 Encounter for examination of eyes and vision without abnormal findings: Secondary | ICD-10-CM

## 2023-06-16 DIAGNOSIS — I10 Essential (primary) hypertension: Secondary | ICD-10-CM | POA: Diagnosis not present

## 2023-06-16 DIAGNOSIS — E1165 Type 2 diabetes mellitus with hyperglycemia: Secondary | ICD-10-CM

## 2023-06-16 DIAGNOSIS — E785 Hyperlipidemia, unspecified: Secondary | ICD-10-CM

## 2023-06-16 DIAGNOSIS — Z7984 Long term (current) use of oral hypoglycemic drugs: Secondary | ICD-10-CM

## 2023-06-16 LAB — POCT GLYCOSYLATED HEMOGLOBIN (HGB A1C): HbA1c, POC (controlled diabetic range): 7.3 % — AB (ref 0.0–7.0)

## 2023-06-16 MED ORDER — LISINOPRIL-HYDROCHLOROTHIAZIDE 20-25 MG PO TABS
1.0000 | ORAL_TABLET | Freq: Every day | ORAL | 2 refills | Status: DC
Start: 2023-06-16 — End: 2023-07-21

## 2023-06-16 MED ORDER — ATORVASTATIN CALCIUM 40 MG PO TABS
60.0000 mg | ORAL_TABLET | Freq: Every day | ORAL | 0 refills | Status: DC
Start: 2023-06-16 — End: 2023-07-21

## 2023-06-16 MED ORDER — METFORMIN HCL 500 MG PO TABS
1000.0000 mg | ORAL_TABLET | Freq: Every day | ORAL | 0 refills | Status: DC
Start: 2023-06-16 — End: 2023-07-21

## 2023-06-16 NOTE — Progress Notes (Signed)
Patient ID: Jose Kelley, male    DOB: January 11, 1966  MRN: [id number]  CC: Chronic Conditions Follow-Up  Subjective: Jose Kelley is a 57 y.o. male who presents for chronic conditions follow-up.   His concerns today include:  - Doing well on Lisinopril-Hydrochlorothiazide, no issues/concerns. He does not complain of red flag symptoms such as but not limited to chest pain, shortness of breath, worst headache of life, nausea/vomiting.  - Doing well on Metformin, no issues/concerns. He denies red flag symptoms associated with diabetes.  - Doing well on Atorvastatin, no issues/concerns.    Patient Active Problem List   Diagnosis Date Noted   Type 2 diabetes mellitus with hyperglycemia, without long-term current use of insulin ([facility]) 01/14/2017   Hyperlipidemia 01/14/2017   Tobacco use disorder 01/14/2017   Essential hypertension 01/14/2017     Current Outpatient Medications on File Prior to Visit  Medication Sig Dispense Refill   blood glucose meter kit and supplies KIT Dispense based on patient and insurance preference. Use up to four times daily as directed. E11.65 1 each 0   glucose blood test strip Use as instructed 100 each 12   lisinopril-hydrochlorothiazide (ZESTORETIC) 20-25 MG tablet Take 1 tablet by mouth daily. 30 tablet 0   metFORMIN (GLUCOPHAGE) 500 MG tablet Take 2 tablets (1,000 mg total) by mouth daily with breakfast. 60 tablet 2   atorvastatin (LIPITOR) 40 MG tablet Take 1.5 tablets (60 mg total) by mouth daily. 45 tablet 2   No current facility-administered medications on file prior to visit.    No Known Allergies  Social History   Socioeconomic History   Marital status: Married    Spouse name: Not on file   Number of children: Not on file   Years of education: Not on file   Highest education level: Not on file  Occupational History   Not on file  Tobacco Use   Smoking status: Every Day    Current packs/day: 0.00    Average packs/day: 0.3 packs/day for  10.0 years (2.5 ttl pk-yrs)    Types: Cigarettes    Start date: 07/29/2010    Last attempt to quit: 07/29/2020    Years since quitting: 2.8    Passive exposure: Current   Smokeless tobacco: Never  Vaping Use   Vaping status: Never Used  Substance and Sexual Activity   Alcohol use: Yes    Comment: Occasional beer   Drug use: No   Sexual activity: Not on file  Other Topics Concern   Not on file  Social History Narrative   Not on file   Social Determinants of Health   Financial Resource Strain: Not on file  Food Insecurity: Not on file  Transportation Needs: Not on file  Physical Activity: Not on file  Stress: Not on file  Social Connections: Not on file  Intimate Partner Violence: Not on file    Family History  Problem Relation Age of Onset   Diabetes Mother    Stroke Mother    Cancer Brother    Pancreatic cancer Brother    Breast cancer Sister    Colon cancer Neg Hx    Esophageal cancer Neg Hx    Rectal cancer Neg Hx    Stomach cancer Neg Hx    Colon polyps Neg Hx     Past Surgical History:  Procedure Laterality Date   COLONOSCOPY     POLYPECTOMY     SHOULDER SURGERY Right     ROS: Review of Systems Negative  except as stated above  PHYSICAL EXAM: BP 127/76   Pulse 66   Temp 98.2 F (36.8 C) (Oral)   Ht 5' 7.75" (1.721 m)   Wt 166 lb 3.2 oz (75.4 kg)   SpO2 96%   BMI 25.46 kg/m   Physical Exam HENT:     Head: Normocephalic and atraumatic.     Nose: Nose normal.     Mouth/Throat:     Mouth: Mucous membranes are moist.     Pharynx: Oropharynx is clear.  Eyes:     Extraocular Movements: Extraocular movements intact.     Conjunctiva/sclera: Conjunctivae normal.     Pupils: Pupils are equal, round, and reactive to light.  Cardiovascular:     Rate and Rhythm: Normal rate and regular rhythm.     Pulses: Normal pulses.     Heart sounds: Normal heart sounds.  Pulmonary:     Effort: Pulmonary effort is normal.     Breath sounds: Normal breath  sounds.  Musculoskeletal:        General: Normal range of motion.     Cervical back: Normal range of motion and neck supple.  Neurological:     General: No focal deficit present.     Mental Status: He is alert and oriented to person, place, and time.  Psychiatric:        Mood and Affect: Mood normal.        Behavior: Behavior normal.     Results for orders placed or performed in visit on 06/16/23  POCT glycosylated hemoglobin (Hb A1C)  Result Value Ref Range   Hemoglobin A1C     HbA1c POC (<> result, manual entry)     HbA1c, POC (prediabetic range)     HbA1c, POC (controlled diabetic range) 7.3 (A) 0.0 - 7.0 %    ASSESSMENT AND PLAN: 1. Primary hypertension - Continue Lisinopril-Hydrochlorothiazide as prescribed.  - Routine screening.  - Counseled on blood pressure goal of less than 130/80, low-sodium, DASH diet, medication compliance, and 150 minutes of moderate intensity exercise per week as tolerated. Counseled on medication adherence and adverse effects. - Follow-up with primary provider in 3  months or sooner if needed.  - lisinopril-hydrochlorothiazide (ZESTORETIC) 20-25 MG tablet; Take 1 tablet by mouth daily.  Dispense: 30 tablet; Refill: 2 - Basic Metabolic Panel  2. Type 2 diabetes mellitus with hyperglycemia, without long-term current use of insulin (HCC) - Hemoglobin A1c not at goal at 7.3%, goal 7%. - Continue Metformin as prescribed.  - I discussed with patient recommendation to add second diabetes medication. Patient declined.  - Routine screening.  - Discussed the importance of healthy eating habits, low-carbohydrate diet, low-sugar diet, regular aerobic exercise (at least 150 minutes a week as tolerated) and medication compliance to achieve or maintain control of diabetes. - Follow-up with primary provider in 4 weeks or sooner if needed.  - POCT glycosylated hemoglobin (Hb A1C) - metFORMIN (GLUCOPHAGE) 500 MG tablet; Take 2 tablets (1,000 mg total) by mouth  daily with breakfast.  Dispense: 60 tablet; Refill: 0 - Microalbumin / creatinine urine ratio  3. Diabetic eye exam [facility]) - Referral to Ophthalmology for evaluation/management.  - Ambulatory referral to Ophthalmology  4. Encounter for diabetic foot exam [facility]) - Referral to Podiatry for evaluation/management. - Ambulatory referral to Podiatry  5. Hyperlipidemia, unspecified hyperlipidemia type - Continue Atorvastatin as prescribed.  - Routine screening.  - Follow-up with primary provider as scheduled.  - atorvastatin (LIPITOR) 40 MG tablet; Take 1.5 tablets (60  mg total) by mouth daily.  Dispense: 45 tablet; Refill: 0 - Lipid panel  6. Immunization due - Administered.  - Flu vaccine trivalent PF, 6mos and older(Flulaval,Afluria,Fluarix,Fluzone)    Patient was given the opportunity to ask questions.  Patient verbalized understanding of the plan and was able to repeat key elements of the plan. Patient was given clear instructions to go to Emergency Department or return to medical center if symptoms don't improve, worsen, or new problems develop.The patient verbalized understanding.   Orders Placed This Encounter  Procedures   Flu vaccine trivalent PF, 6mos and older(Flulaval,Afluria,Fluarix,Fluzone)   Microalbumin / creatinine urine ratio   Basic Metabolic Panel   Lipid panel   Ambulatory referral to Ophthalmology   Ambulatory referral to Podiatry   POCT glycosylated hemoglobin (Hb A1C)     Requested Prescriptions   Signed Prescriptions Disp Refills   atorvastatin (LIPITOR) 40 MG tablet 45 tablet 0    Sig: Take 1.5 tablets (60 mg total) by mouth daily.   lisinopril-hydrochlorothiazide (ZESTORETIC) 20-25 MG tablet 30 tablet 2    Sig: Take 1 tablet by mouth daily.   metFORMIN (GLUCOPHAGE) 500 MG tablet 60 tablet 0    Sig: Take 2 tablets (1,000 mg total) by mouth daily with breakfast.    Return in about 3 months (around 09/15/2023) for Follow-Up or next available chronic  conditions and 4 weeks diabetes .  Rema Fendt, NP

## 2023-06-16 NOTE — Progress Notes (Signed)
7.0 Patient states no concerns to discuss.   Patient wants Flu shot.

## 2023-06-17 LAB — BASIC METABOLIC PANEL
BUN/Creatinine Ratio: 13 (ref 9–20)
BUN: 12 mg/dL (ref 6–24)
CO2: 23 mmol/L (ref 20–29)
Calcium: 10.2 mg/dL (ref 8.7–10.2)
Chloride: 99 mmol/L (ref 96–106)
Creatinine, Ser: 0.93 mg/dL (ref 0.76–1.27)
Glucose: 87 mg/dL (ref 70–99)
Potassium: 4 mmol/L (ref 3.5–5.2)
Sodium: 137 mmol/L (ref 134–144)
eGFR: 95 mL/min/{1.73_m2} (ref >59)

## 2023-06-17 LAB — LIPID PANEL
Chol/HDL Ratio: 3.8 {ratio} (ref 0.0–5.0)
Cholesterol, Total: 152 mg/dL (ref 100–199)
HDL: 40 mg/dL (ref >39)
LDL Chol Calc (NIH): 95 mg/dL (ref 0–99)
Triglycerides: 91 mg/dL (ref 0–149)
VLDL Cholesterol Cal: 17 mg/dL (ref 5–40)

## 2023-06-17 LAB — MICROALBUMIN / CREATININE URINE RATIO
Creatinine, Urine: 54.7 mg/dL
Microalb/Creat Ratio: <5 mg/g{creat} (ref 0–29)
Microalbumin, Urine: <3 ug/mL

## 2023-07-21 ENCOUNTER — Ambulatory Visit (INDEPENDENT_AMBULATORY_CARE_PROVIDER_SITE_OTHER): Payer: Managed Care, Other (non HMO) | Admitting: Podiatry

## 2023-07-21 ENCOUNTER — Ambulatory Visit (INDEPENDENT_AMBULATORY_CARE_PROVIDER_SITE_OTHER): Payer: Managed Care, Other (non HMO) | Admitting: Family

## 2023-07-21 ENCOUNTER — Encounter: Payer: Self-pay | Admitting: Family

## 2023-07-21 ENCOUNTER — Encounter: Payer: Self-pay | Admitting: Podiatry

## 2023-07-21 VITALS — BP 117/80 | HR 68 | Temp 98.2°F | Ht 67.0 in | Wt 163.2 lb

## 2023-07-21 DIAGNOSIS — Z7984 Long term (current) use of oral hypoglycemic drugs: Secondary | ICD-10-CM

## 2023-07-21 DIAGNOSIS — E1165 Type 2 diabetes mellitus with hyperglycemia: Secondary | ICD-10-CM

## 2023-07-21 DIAGNOSIS — M25511 Pain in right shoulder: Secondary | ICD-10-CM

## 2023-07-21 DIAGNOSIS — I10 Essential (primary) hypertension: Secondary | ICD-10-CM | POA: Diagnosis not present

## 2023-07-21 DIAGNOSIS — E785 Hyperlipidemia, unspecified: Secondary | ICD-10-CM | POA: Diagnosis not present

## 2023-07-21 MED ORDER — LISINOPRIL-HYDROCHLOROTHIAZIDE 20-25 MG PO TABS: 1.0000 | ORAL_TABLET | Freq: Every day | ORAL | 2 refills | Status: DC

## 2023-07-21 MED ORDER — ATORVASTATIN CALCIUM 40 MG PO TABS
60.0000 mg | ORAL_TABLET | Freq: Every day | ORAL | 2 refills | Status: DC
Start: 2023-07-21 — End: 2023-12-08

## 2023-07-21 MED ORDER — METFORMIN HCL 500 MG PO TABS: 1000.0000 mg | ORAL_TABLET | Freq: Every day | ORAL | 2 refills | Status: DC

## 2023-07-21 NOTE — Progress Notes (Signed)
Patient ID: Jose Kelley, male    DOB: 12-Oct-1965  MRN: [id number]  CC: Chronic Conditions Follow-Up  Subjective: Jose Kelley is a 57 y.o. male who presents for chronic conditions follow-up.  His concerns today include:  - Doing well on Lisinopril-Hydrochlorothiazide, no issues/concerns. Home blood pressures at goal .He does not complain of red flag symptoms such as but not limited to chest pain, shortness of breath, worst headache of life, nausea/vomiting.  - Doing well on Metformin, no issues/concerns. He denies red flag symptoms associated with diabetes.  - Doing well on Atorvastatin, no issues/concerns.  - Established with Podiatry.  - Right shoulder pain persisting since seen at Urgent Care. History of right shoulder surgery in 1984. Would like referral to specialist.   Patient Active Problem List   Diagnosis Date Noted   Type 2 diabetes mellitus with hyperglycemia, without long-term current use of insulin ([facility]) 01/14/2017   Hyperlipidemia 01/14/2017   Tobacco use disorder 01/14/2017   Essential hypertension 01/14/2017     Current Outpatient Medications on File Prior to Visit  Medication Sig Dispense Refill   blood glucose meter kit and supplies KIT Dispense based on patient and insurance preference. Use up to four times daily as directed. E11.65 1 each 0   glucose blood test strip Use as instructed 100 each 12   No current facility-administered medications on file prior to visit.    No Known Allergies  Social History   Socioeconomic History   Marital status: Married    Spouse name: Not on file   Number of children: Not on file   Years of education: Not on file   Highest education level: 12th grade  Occupational History   Not on file  Tobacco Use   Smoking status: Every Day    Current packs/day: 0.00    Average packs/day: 0.3 packs/day for 10.0 years (2.5 ttl pk-yrs)    Types: Cigarettes    Start date: 07/29/2010    Last attempt to quit: 07/29/2020     Years since quitting: 2.9    Passive exposure: Current   Smokeless tobacco: Never  Vaping Use   Vaping status: Never Used  Substance and Sexual Activity   Alcohol use: Yes    Comment: Occasional beer   Drug use: No   Sexual activity: Not on file  Other Topics Concern   Not on file  Social History Narrative   Not on file   Social Determinants of Health   Financial Resource Strain: Not on file  Food Insecurity: Not on file  Transportation Needs: No Transportation Needs (07/17/2023)   PRAPARE - Administrator, Civil Service (Medical): No    Lack of Transportation (Non-Medical): No  Physical Activity: Insufficiently Active (07/17/2023)   Exercise Vital Sign    Days of Exercise per Week: 1 day    Minutes of Exercise per Session: 20 min  Stress: No Stress Concern Present (07/17/2023)   [employer] of Occupational Health - Occupational Stress Questionnaire    Feeling of Stress : Not at all  Social Connections: Unknown (07/17/2023)   Social Connection and Isolation Panel [NHANES]    Frequency of Communication with Friends and Family: Not on file    Frequency of Social Gatherings with Friends and Family: Not on file    Attends Religious Services: Not on file    Active Member of Clubs or Organizations: Yes    Attends Banker Meetings: Patient declined    Marital Status: Not on  file  Intimate Partner Violence: Not on file    Family History  Problem Relation Age of Onset   Diabetes Mother    Stroke Mother    Cancer Brother    Pancreatic cancer Brother    Breast cancer Sister    Colon cancer Neg Hx    Esophageal cancer Neg Hx    Rectal cancer Neg Hx    Stomach cancer Neg Hx    Colon polyps Neg Hx     Past Surgical History:  Procedure Laterality Date   COLONOSCOPY     POLYPECTOMY     SHOULDER SURGERY Right     ROS: Review of Systems Negative except as stated above  PHYSICAL EXAM: BP 117/80   Pulse 68   Temp 98.2 F (36.8 C) (Oral)    Ht 5\' 7"  (1.702 m)   Wt 163 lb 3.2 oz (74 kg)   SpO2 95%   BMI 25.56 kg/m   Physical Exam HENT:     Head: Normocephalic and atraumatic.     Nose: Nose normal.     Mouth/Throat:     Mouth: Mucous membranes are moist.     Pharynx: Oropharynx is clear.  Eyes:     Extraocular Movements: Extraocular movements intact.     Conjunctiva/sclera: Conjunctivae normal.     Pupils: Pupils are equal, round, and reactive to light.  Cardiovascular:     Rate and Rhythm: Normal rate and regular rhythm.     Pulses: Normal pulses.     Heart sounds: Normal heart sounds.  Pulmonary:     Effort: Pulmonary effort is normal.     Breath sounds: Normal breath sounds.  Musculoskeletal:        General: Normal range of motion.     Right shoulder: Normal.     Left shoulder: Normal.     Right upper arm: Normal.     Left upper arm: Normal.     Right elbow: Normal.     Left elbow: Normal.     Right forearm: Normal.     Left forearm: Normal.     Right wrist: Normal.     Left wrist: Normal.     Right hand: Normal.     Left hand: Normal.     Cervical back: Normal, normal range of motion and neck supple.     Thoracic back: Normal.     Lumbar back: Normal.     Right hip: Normal.     Left hip: Normal.     Right upper leg: Normal.     Left upper leg: Normal.     Right knee: Normal.     Left knee: Normal.     Right lower leg: Normal.     Left lower leg: Normal.     Right ankle: Normal.     Left ankle: Normal.     Right foot: Normal.     Left foot: Normal.  Neurological:     General: No focal deficit present.     Mental Status: He is alert and oriented to person, place, and time.  Psychiatric:        Mood and Affect: Mood normal.        Behavior: Behavior normal.      ASSESSMENT AND PLAN: 1. Primary hypertension - Continue Lisinopril-Hydrochlorothiazide as prescribed. - Counseled on blood pressure goal of less than 130/80, low-sodium, DASH diet, medication compliance, and 150 minutes of  moderate intensity exercise per week as tolerated. Counseled on medication adherence and  adverse effects. - Follow-up with primary provider in 3 months or sooner if needed.  - lisinopril-hydrochlorothiazide (ZESTORETIC) 20-25 MG tablet; Take 1 tablet by mouth daily.  Dispense: 30 tablet; Refill: 2  2. Type 2 diabetes mellitus with hyperglycemia, without long-term current use of insulin ([facility]) - Continue Metformin as prescribed. Counseled on medication adherence/adverse effects. - Routine screening.  - Discussed the importance of healthy eating habits, low-carbohydrate diet, low-sugar diet, regular aerobic exercise (at least 150 minutes a week as tolerated) and medication compliance to achieve or maintain control of diabetes. - Follow-up with primary provider as scheduled.  - metFORMIN (GLUCOPHAGE) 500 MG tablet; Take 2 tablets (1,000 mg total) by mouth daily with breakfast.  Dispense: 60 tablet; Refill: 2 - POCT glycosylated hemoglobin (Hb A1C); Future  3. Hyperlipidemia, unspecified hyperlipidemia type - Continue Atorvastatin as prescribed. Counseled on medication adherence/adverse effects.  - Follow-up with primary provider as scheduled. - atorvastatin (LIPITOR) 40 MG tablet; Take 1.5 tablets (60 mg total) by mouth daily.  Dispense: 45 tablet; Refill: 2  4. Right shoulder pain, unspecified chronicity - Referral to Orthopedic Surgery for evaluation/management. - Ambulatory referral to Orthopedic Surgery   Patient was given the opportunity to ask questions.  Patient verbalized understanding of the plan and was able to repeat key elements of the plan. Patient was given clear instructions to go to Emergency Department or return to medical center if symptoms don't improve, worsen, or new problems develop.The patient verbalized understanding.   Orders Placed This Encounter  Procedures   Ambulatory referral to Orthopedic Surgery   POCT glycosylated hemoglobin (Hb A1C)     Requested  Prescriptions   Signed Prescriptions Disp Refills   lisinopril-hydrochlorothiazide (ZESTORETIC) 20-25 MG tablet 30 tablet 2    Sig: Take 1 tablet by mouth daily.   metFORMIN (GLUCOPHAGE) 500 MG tablet 60 tablet 2    Sig: Take 2 tablets (1,000 mg total) by mouth daily with breakfast.   atorvastatin (LIPITOR) 40 MG tablet 45 tablet 2    Sig: Take 1.5 tablets (60 mg total) by mouth daily.    Return in about 3 months (around 10/21/2023) for Follow-Up or next available chronic conditions.  Rema Fendt, NP

## 2023-07-21 NOTE — Progress Notes (Signed)
This patient presents to the office with chief complaint of diabetic feet.  This patient  says there  is  no pain and discomfort in his feet.  Patient has no history of infection or drainage from both feet.  . This patient presents  to the office today for  foot evaluation due to history of  diabetes.  General Appearance  Alert, conversant and in no acute stress.  Vascular  Dorsalis pedis and posterior tibial  pulses are palpable  bilaterally.  Capillary return is within normal limits  bilaterally. Temperature is within normal limits  bilaterally.  Neurologic  Senn-Weinstein monofilament wire test within normal limits  bilaterally. Muscle power within normal limits bilaterally.  Nails Thick disfigured discolored nails with subungual debris  from hallux to fifth toes bilaterally. No evidence of bacterial infection or drainage bilaterally.  Orthopedic  No limitations of motion of motion feet .  No crepitus or effusions noted.  No bony pathology or digital deformities noted. Asymptomatic  HAV.  Skin  normotropic skin with no porokeratosis noted bilaterally.  No signs of infections or ulcers noted.      Diabetes with no foot complications  IE    A diabetic foot exam was performed and there is no evidence of any vascular or neurologic pathology.   RTC 1 year.   Helane Gunther DPM

## 2023-07-21 NOTE — Progress Notes (Signed)
Patient states he fell last week and his arm is still sore.

## 2023-08-22 ENCOUNTER — Telehealth: Payer: Self-pay | Admitting: Family

## 2023-09-18 ENCOUNTER — Telehealth: Payer: Self-pay | Admitting: Family

## 2023-10-20 ENCOUNTER — Ambulatory Visit: Payer: Managed Care, Other (non HMO) | Admitting: Family

## 2023-11-10 ENCOUNTER — Ambulatory Visit: Payer: Managed Care, Other (non HMO) | Admitting: Family

## 2023-12-08 ENCOUNTER — Encounter: Payer: Self-pay | Admitting: Family

## 2023-12-08 ENCOUNTER — Ambulatory Visit (INDEPENDENT_AMBULATORY_CARE_PROVIDER_SITE_OTHER): Payer: Managed Care, Other (non HMO) | Admitting: Family

## 2023-12-08 VITALS — BP 109/81 | HR 74 | Temp 97.9°F | Ht 67.0 in | Wt 167.2 lb

## 2023-12-08 DIAGNOSIS — E1165 Type 2 diabetes mellitus with hyperglycemia: Secondary | ICD-10-CM | POA: Diagnosis not present

## 2023-12-08 DIAGNOSIS — E785 Hyperlipidemia, unspecified: Secondary | ICD-10-CM | POA: Diagnosis not present

## 2023-12-08 DIAGNOSIS — E119 Type 2 diabetes mellitus without complications: Secondary | ICD-10-CM

## 2023-12-08 DIAGNOSIS — I10 Essential (primary) hypertension: Secondary | ICD-10-CM

## 2023-12-08 DIAGNOSIS — Z7984 Long term (current) use of oral hypoglycemic drugs: Secondary | ICD-10-CM

## 2023-12-08 LAB — POCT GLYCOSYLATED HEMOGLOBIN (HGB A1C): HbA1c, POC (controlled diabetic range): 8.1 % — AB (ref 0.0–7.0)

## 2023-12-08 MED ORDER — ATORVASTATIN CALCIUM 40 MG PO TABS: 60.0000 mg | ORAL_TABLET | Freq: Every day | ORAL | 2 refills | Status: DC

## 2023-12-08 MED ORDER — LISINOPRIL-HYDROCHLOROTHIAZIDE 20-25 MG PO TABS: 1.0000 | ORAL_TABLET | Freq: Every day | ORAL | 2 refills | Status: DC

## 2023-12-08 MED ORDER — METFORMIN HCL 500 MG PO TABS: 1000.0000 mg | ORAL_TABLET | Freq: Every day | ORAL | 2 refills | Status: DC

## 2023-12-08 MED ORDER — METFORMIN HCL 1000 MG PO TABS: 1000.0000 mg | ORAL_TABLET | Freq: Two times a day (BID) | ORAL | 0 refills | Status: DC

## 2023-12-08 NOTE — Progress Notes (Signed)
 Patient ID: Jose Kelley, male    DOB: 1966/08/02  MRN: [id number]  CC: Chronic Conditions Follow-Up  Subjective: Jose Kelley is a 58 y.o. male who presents for chronic conditions follow-up.   His concerns today include:  - Doing well on Lisinopril-Hydrochlorothiazide, no issues/concerns. He does not complain of red flag symptoms such as but not limited to chest pain, shortness of breath, worst headache of life, nausea/vomiting.  - Doing well on Metformin, no issues/concerns. Denies red flag symptoms associated with diabetes. States has been eating more grapes. - Doing well on Atorvastatin, no issues/concerns.   Patient Active Problem List   Diagnosis Date Noted   Type 2 diabetes mellitus with hyperglycemia, without long-term current use of insulin (HCC) 01/14/2017   Hyperlipidemia 01/14/2017   Tobacco use disorder 01/14/2017   Essential hypertension 01/14/2017     Current Outpatient Medications on File Prior to Visit  Medication Sig Dispense Refill   blood glucose meter kit and supplies KIT Dispense based on patient and insurance preference. Use up to four times daily as directed. E11.65 1 each 0   glucose blood test strip Use as instructed 100 each 12   No current facility-administered medications on file prior to visit.    No Known Allergies  Social History   Socioeconomic History   Marital status: Married    Spouse name: Not on file   Number of children: Not on file   Years of education: Not on file   Highest education level: 12th grade  Occupational History   Not on file  Tobacco Use   Smoking status: Every Day    Current packs/day: 0.00    Average packs/day: 0.3 packs/day for 10.0 years (2.5 ttl pk-yrs)    Types: Cigarettes    Start date: 07/29/2010    Last attempt to quit: 07/29/2020    Years since quitting: 3.3    Passive exposure: Current   Smokeless tobacco: Never  Vaping Use   Vaping status: Never Used  Substance and Sexual Activity   Alcohol  use: Yes    Comment: Occasional beer   Drug use: No   Sexual activity: Not on file  Other Topics Concern   Not on file  Social History Narrative   Not on file   Social Drivers of Health   Financial Resource Strain: Patient Declined (12/04/2023)   Overall Financial Resource Strain (CARDIA)    Difficulty of Paying Living Expenses: Patient declined  Food Insecurity: Patient Declined (12/04/2023)   Hunger Vital Sign    Worried About Running Out of Food in the Last Year: Patient declined    Ran Out of Food in the Last Year: Patient declined  Transportation Needs: Patient Declined (12/04/2023)   PRAPARE - Administrator, Civil Service (Medical): Patient declined    Lack of Transportation (Non-Medical): Patient declined  Physical Activity: Unknown (12/04/2023)   Exercise Vital Sign    Days of Exercise per Week: Patient declined    Minutes of Exercise per Session: 20 min  Stress: No Stress Concern Present (12/04/2023)   [employer] of Occupational Health - Occupational Stress Questionnaire    Feeling of Stress : Not at all  Social Connections: Unknown (12/04/2023)   Social Connection and Isolation Panel [NHANES]    Frequency of Communication with Friends and Family: Patient declined    Frequency of Social Gatherings with Friends and Family: Patient declined    Attends Religious Services: More than 4 times per year    Active  Member of Clubs or Organizations: Yes    Attends Engineer, structural: Patient declined    Marital Status: Married  Catering manager Violence: Not on file    Family History  Problem Relation Age of Onset   Diabetes Mother    Stroke Mother    Cancer Brother    Pancreatic cancer Brother    Breast cancer Sister    Colon cancer Neg Hx    Esophageal cancer Neg Hx    Rectal cancer Neg Hx    Stomach cancer Neg Hx    Colon polyps Neg Hx     Past Surgical History:  Procedure Laterality Date   COLONOSCOPY     POLYPECTOMY     SHOULDER  SURGERY Right     ROS: Review of Systems Negative except as stated above  PHYSICAL EXAM: BP 109/81   Pulse 74   Temp 97.9 F (36.6 C) (Oral)   Ht 5\' 7"  (1.702 m)   Wt 167 lb 3.2 oz (75.8 kg)   SpO2 93%   BMI 26.19 kg/m   Physical Exam HENT:     Head: Normocephalic and atraumatic.     Nose: Nose normal.     Mouth/Throat:     Mouth: Mucous membranes are moist.     Pharynx: Oropharynx is clear.  Eyes:     Extraocular Movements: Extraocular movements intact.     Conjunctiva/sclera: Conjunctivae normal.     Pupils: Pupils are equal, round, and reactive to light.  Cardiovascular:     Rate and Rhythm: Normal rate and regular rhythm.     Pulses: Normal pulses.     Heart sounds: Normal heart sounds.  Pulmonary:     Effort: Pulmonary effort is normal.     Breath sounds: Normal breath sounds.  Musculoskeletal:        General: Normal range of motion.     Cervical back: Normal range of motion and neck supple.  Neurological:     General: No focal deficit present.     Mental Status: He is alert and oriented to person, place, and time.  Psychiatric:        Mood and Affect: Mood normal.        Behavior: Behavior normal.       Results for orders placed or performed in visit on 12/08/23  POCT glycosylated hemoglobin (Hb A1C)  Result Value Ref Range   Hemoglobin A1C     HbA1c POC (<> result, manual entry)     HbA1c, POC (prediabetic range)     HbA1c, POC (controlled diabetic range) 8.1 (A) 0.0 - 7.0 %    ASSESSMENT AND PLAN: 1. Primary hypertension (Primary) - Continue Lisinopril-Hydrochlorothiazide as prescribed.  - Routine screening.  - Counseled on blood pressure goal of less than 130/80, low-sodium, DASH diet, medication compliance, and 150 minutes of moderate intensity exercise per week as tolerated. Counseled on medication adherence and adverse effects. - Follow-up with primary provider in 3 months or sooner if needed. - lisinopril-hydrochlorothiazide (ZESTORETIC)  20-25 MG tablet; Take 1 tablet by mouth daily.  Dispense: 30 tablet; Refill: 2 - Basic Metabolic Panel  2. Type 2 diabetes mellitus with hyperglycemia, without long-term current use of insulin (HCC) - Hemoglobin not at goal at 8.1%, goal 7%.  - Increase Metformin from 1000 mg daily to 1000 mg twice daily.  - Discussed the importance of healthy eating habits, low-carbohydrate diet, low-sugar diet, regular aerobic exercise (at least 150 minutes a week as tolerated) and medication compliance  to achieve or maintain control of diabetes. Counseled on medication adherence/adverse effects. - Follow-up with primary provider in 4 weeks or sooner if needed. - POCT glycosylated hemoglobin (Hb A1C) - metFORMIN (GLUCOPHAGE) 1000 MG tablet; Take 1 tablet (1,000 mg total) by mouth 2 (two) times daily with a meal.  Dispense: 180 tablet; Refill: 0  3. Diabetic eye exam [facility]) - Referral to Ophthalmology for evaluation/management. - Ambulatory referral to Ophthalmology  4. Encounter for diabetic foot exam [facility]) - Referral to Podiatry for evaluation/management. - Ambulatory referral to Podiatry  5. Hyperlipidemia, unspecified hyperlipidemia type - Continue Atorvastatin as prescribed. Counseled on medication adherence/adverse effects.  - Follow-up with primary provider in 3 months or sooner if needed. - atorvastatin (LIPITOR) 40 MG tablet; Take 1.5 tablets (60 mg total) by mouth daily.  Dispense: 45 tablet; Refill: 2   Patient was given the opportunity to ask questions.  Patient verbalized understanding of the plan and was able to repeat key elements of the plan. Patient was given clear instructions to go to Emergency Department or return to medical center if symptoms don't improve, worsen, or new problems develop.The patient verbalized understanding.   Orders Placed This Encounter  Procedures   Basic Metabolic Panel   Ambulatory referral to Podiatry   Ambulatory referral to Ophthalmology   POCT  glycosylated hemoglobin (Hb A1C)     Requested Prescriptions   Signed Prescriptions Disp Refills   atorvastatin (LIPITOR) 40 MG tablet 45 tablet 2    Sig: Take 1.5 tablets (60 mg total) by mouth daily.   lisinopril-hydrochlorothiazide (ZESTORETIC) 20-25 MG tablet 30 tablet 2    Sig: Take 1 tablet by mouth daily.   metFORMIN (GLUCOPHAGE) 1000 MG tablet 180 tablet 0    Sig: Take 1 tablet (1,000 mg total) by mouth 2 (two) times daily with a meal.    Return in about 4 weeks (around 01/05/2024) for Follow-Up or next available chronic conditions.  Rema Fendt, NP

## 2023-12-08 NOTE — Progress Notes (Signed)
 Patient states no concerns or questions to discuss.   Patient asked for refills on Atorvastatin, Lisinopril, and metformin. All 3 medications were sent in.

## 2023-12-09 LAB — BASIC METABOLIC PANEL
BUN/Creatinine Ratio: 14 (ref 9–20)
BUN: 14 mg/dL (ref 6–24)
CO2: 22 mmol/L (ref 20–29)
Calcium: 10.6 mg/dL — ABNORMAL HIGH (ref 8.7–10.2)
Chloride: 97 mmol/L (ref 96–106)
Creatinine, Ser: 0.97 mg/dL (ref 0.76–1.27)
Glucose: 84 mg/dL (ref 70–99)
Potassium: 3.8 mmol/L (ref 3.5–5.2)
Sodium: 138 mmol/L (ref 134–144)
eGFR: 90 mL/min/{1.73_m2} (ref >59)

## 2023-12-11 ENCOUNTER — Encounter: Payer: Self-pay | Admitting: Family

## 2024-01-12 ENCOUNTER — Ambulatory Visit (INDEPENDENT_AMBULATORY_CARE_PROVIDER_SITE_OTHER): Admitting: Family

## 2024-01-12 ENCOUNTER — Encounter: Payer: Self-pay | Admitting: Family

## 2024-01-12 ENCOUNTER — Other Ambulatory Visit: Payer: Self-pay | Admitting: Family

## 2024-01-12 VITALS — BP 113/77 | HR 94 | Temp 98.7°F | Resp 16 | Ht 66.0 in | Wt 166.6 lb

## 2024-01-12 DIAGNOSIS — E1165 Type 2 diabetes mellitus with hyperglycemia: Secondary | ICD-10-CM | POA: Diagnosis not present

## 2024-01-12 DIAGNOSIS — R0989 Other specified symptoms and signs involving the circulatory and respiratory systems: Secondary | ICD-10-CM | POA: Diagnosis not present

## 2024-01-12 DIAGNOSIS — Z7984 Long term (current) use of oral hypoglycemic drugs: Secondary | ICD-10-CM

## 2024-01-12 LAB — POCT GLYCOSYLATED HEMOGLOBIN (HGB A1C): Hemoglobin A1C: 7.9 % — AB (ref 4.0–5.6)

## 2024-01-12 MED ORDER — GUAIFENESIN 200 MG PO TABS: 200.0000 mg | ORAL_TABLET | Freq: Three times a day (TID) | ORAL | 1 refills | Status: AC | PRN

## 2024-01-12 MED ORDER — EMPAGLIFLOZIN 10 MG PO TABS: 10.0000 mg | ORAL_TABLET | Freq: Every day | ORAL | 0 refills | Status: DC

## 2024-01-12 MED ORDER — PSEUDOEPH-BROMPHEN-DM 30-2-10 MG/5ML PO SYRP
5.0000 mL | ORAL_SOLUTION | Freq: Four times a day (QID) | ORAL | 0 refills | Status: AC | PRN
Start: 2024-01-12 — End: ?

## 2024-01-12 NOTE — Progress Notes (Signed)
 Patient ID: Jose Kelley, male    DOB: 10/23/1965  MRN: [id number]  CC: Chronic Conditions Follow-Up  Subjective: Jose Kelley is a 58 y.o. male who presents for chronic conditions follow-up.   His concerns today include:  - Doing well on Metformin , no issues/concerns. Denies red flag symptoms associated with diabetes.  - Reports congestion for a couple days. Denies red flag symptoms. States he has a cold due to his wife turned on the house air conditioning too low.  Patient Active Problem List   Diagnosis Date Noted   Type 2 diabetes mellitus with hyperglycemia, without long-term current use of insulin (HCC) 01/14/2017   Hyperlipidemia 01/14/2017   Tobacco use disorder 01/14/2017   Essential hypertension 01/14/2017     Current Outpatient Medications on File Prior to Visit  Medication Sig Dispense Refill   atorvastatin  (LIPITOR) 40 MG tablet Take 1.5 tablets (60 mg total) by mouth daily. 45 tablet 2   blood glucose meter kit and supplies KIT Dispense based on patient and insurance preference. Use up to four times daily as directed. E11.65 1 each 0   glucose blood test strip Use as instructed 100 each 12   lisinopril -hydrochlorothiazide  (ZESTORETIC ) 20-25 MG tablet Take 1 tablet by mouth daily. 30 tablet 2   metFORMIN  (GLUCOPHAGE ) 1000 MG tablet Take 1 tablet (1,000 mg total) by mouth 2 (two) times daily with a meal. 180 tablet 0   No current facility-administered medications on file prior to visit.    No Known Allergies  Social History   Socioeconomic History   Marital status: Married    Spouse name: Not on file   Number of children: Not on file   Years of education: Not on file   Highest education level: 12th grade  Occupational History   Not on file  Tobacco Use   Smoking status: Every Day    Current packs/day: 0.00    Average packs/day: 0.3 packs/day for 10.0 years (2.5 ttl pk-yrs)    Types: Cigarettes    Start date: 07/29/2010    Last attempt to quit:  07/29/2020    Years since quitting: 3.4    Passive exposure: Current   Smokeless tobacco: Never  Vaping Use   Vaping status: Never Used  Substance and Sexual Activity   Alcohol use: Yes    Comment: Occasional beer   Drug use: No   Sexual activity: Not on file  Other Topics Concern   Not on file  Social History Narrative   Not on file   Social Drivers of Health   Financial Resource Strain: Low Risk  (01/12/2024)   Overall Financial Resource Strain (CARDIA)    Difficulty of Paying Living Expenses: Not hard at all  Food Insecurity: No Food Insecurity (01/12/2024)   Hunger Vital Sign    Worried About Running Out of Food in the Last Year: Never true    Ran Out of Food in the Last Year: Never true  Transportation Needs: No Transportation Needs (01/12/2024)   PRAPARE - Administrator, Civil Service (Medical): No    Lack of Transportation (Non-Medical): No  Physical Activity: Inactive (01/12/2024)   Exercise Vital Sign    Days of Exercise per Week: 0 days    Minutes of Exercise per Session: 0 min  Stress: No Stress Concern Present (12/04/2023)   [employer] of Occupational Health - Occupational Stress Questionnaire    Feeling of Stress : Not at all  Social Connections: Socially Integrated (01/12/2024)  Social Advertising account executive [NHANES]    Frequency of Communication with Friends and Family: Twice a week    Frequency of Social Gatherings with Friends and Family: Twice a week    Attends Religious Services: 1 to 4 times per year    Active Member of [employer] or Organizations: Patient unable to answer    Attends Banker Meetings: 1 to 4 times per year    Marital Status: Married  Catering manager Violence: Not At Risk (01/12/2024)   Humiliation, Afraid, Rape, and Kick questionnaire    Fear of Current or Ex-Partner: No    Emotionally Abused: No    Physically Abused: No    Sexually Abused: No    Family History  Problem Relation Age of Onset    Diabetes Mother    Stroke Mother    Cancer Brother    Pancreatic cancer Brother    Breast cancer Sister    Colon cancer Neg Hx    Esophageal cancer Neg Hx    Rectal cancer Neg Hx    Stomach cancer Neg Hx    Colon polyps Neg Hx     Past Surgical History:  Procedure Laterality Date   COLONOSCOPY     POLYPECTOMY     SHOULDER SURGERY Right     ROS: Review of Systems Negative except as stated above  PHYSICAL EXAM: BP 113/77   Pulse 94   Temp 98.7 F (37.1 C) (Oral)   Resp 16   Ht 5\' 6"  (1.676 m)   Wt 166 lb 9.6 oz (75.6 kg)   SpO2 94%   BMI 26.89 kg/m   Physical Exam HENT:     Head: Normocephalic and atraumatic.     Nose: Nose normal.     Mouth/Throat:     Mouth: Mucous membranes are moist.     Pharynx: Oropharynx is clear.  Eyes:     Extraocular Movements: Extraocular movements intact.     Conjunctiva/sclera: Conjunctivae normal.     Pupils: Pupils are equal, round, and reactive to light.  Cardiovascular:     Rate and Rhythm: Normal rate and regular rhythm.     Pulses: Normal pulses.     Heart sounds: Normal heart sounds.  Pulmonary:     Effort: Pulmonary effort is normal.     Breath sounds: Normal breath sounds.  Musculoskeletal:        General: Normal range of motion.     Cervical back: Normal range of motion and neck supple.  Neurological:     General: No focal deficit present.     Mental Status: He is alert and oriented to person, place, and time.  Psychiatric:        Mood and Affect: Mood normal.        Behavior: Behavior normal.     ASSESSMENT AND PLAN: 1. Type 2 diabetes mellitus with hyperglycemia, without long-term current use of insulin (HCC) (Primary) - Continue Metformin  as prescribed. No refills needed as of present. - Hemoglobin A1c result pending. - Discussed the importance of healthy eating habits, low-carbohydrate diet, low-sugar diet, regular aerobic exercise (at least 150 minutes a week as tolerated) and medication compliance to  achieve or maintain control of diabetes. Counseled on medication adherence/adverse effects.  - Follow-up with primary provider as scheduled. - POCT glycosylated hemoglobin (Hb A1C)  2. Upper respiratory symptom - Patient today in office with no cardiopulmonary/acute distress. - Patient declined respiratory panel.  -  Brompheniramine-Pseudoephedrine-DM and Guaifenesin  as prescribed. Counseled on  medication adherence/adverse effects.  - Follow-up with primary provider as scheduled. - brompheniramine-pseudoephedrine-DM 30-2-10 MG/5ML syrup; Take 5 mLs by mouth 4 (four) times daily as needed.  Dispense: 120 mL; Refill: 0 - guaiFENesin  200 MG tablet; Take 1 tablet (200 mg total) by mouth every 8 (eight) hours as needed for cough or to loosen phlegm.  Dispense: 30 tablet; Refill: 1   Patient was given the opportunity to ask questions.  Patient verbalized understanding of the plan and was able to repeat key elements of the plan. Patient was given clear instructions to go to Emergency Department or return to medical center if symptoms don't improve, worsen, or new problems develop.The patient verbalized understanding.   Orders Placed This Encounter  Procedures   POCT glycosylated hemoglobin (Hb A1C)     Requested Prescriptions   Signed Prescriptions Disp Refills   brompheniramine-pseudoephedrine-DM 30-2-10 MG/5ML syrup 120 mL 0    Sig: Take 5 mLs by mouth 4 (four) times daily as needed.   guaiFENesin  200 MG tablet 30 tablet 1    Sig: Take 1 tablet (200 mg total) by mouth every 8 (eight) hours as needed for cough or to loosen phlegm.    Follow-up with primary provider as scheduled.  Senaida Dama, NP

## 2024-01-12 NOTE — Progress Notes (Signed)
 Patient is here for f/up blood pressure check. Patient said he need some for his summer cold he has x 2 days

## 2024-01-16 ENCOUNTER — Ambulatory Visit: Payer: Self-pay

## 2024-01-16 ENCOUNTER — Other Ambulatory Visit: Payer: Self-pay | Admitting: Family

## 2024-01-16 DIAGNOSIS — E1165 Type 2 diabetes mellitus with hyperglycemia: Secondary | ICD-10-CM

## 2024-01-16 MED ORDER — SITAGLIPTIN PHOSPHATE 25 MG PO TABS: 25.0000 mg | ORAL_TABLET | Freq: Every day | ORAL | 0 refills | Status: DC

## 2024-01-16 NOTE — Telephone Encounter (Signed)
 The patient shares that their prescription for empagliflozin (JARDIANCE) 10 MG TABS tablet [[id number]] has gone from $29 to $75 and would like to discuss cost effective alternatives  Please contact the patient to discuss their concerns further when possible   Copied from CRM [id number]. Topic: Clinical - Prescription Issue >> Jan 16, 2024  8:13 AM Everette C wrote: Reason for CRM: The patient shares that their prescription for empagliflozin (JARDIANCE) 10 MG TABS tablet [[id number]] has gone from $29 to $75 and would like to discuss cost effective alternatives    Please contact the patient to discuss their concerns further when possible

## 2024-01-16 NOTE — Telephone Encounter (Signed)
 I spoke with patient and made them aware of recommendations per PCP.  Patient verbalized understanding

## 2024-01-16 NOTE — Telephone Encounter (Signed)
 Trial Sitagliptin as alternative to Empagliflozin. Continue Metformin . Follow-up in 4 weeks or sooner if needed.

## 2024-01-17 ENCOUNTER — Other Ambulatory Visit: Payer: Self-pay | Admitting: Family

## 2024-01-17 DIAGNOSIS — E1165 Type 2 diabetes mellitus with hyperglycemia: Secondary | ICD-10-CM

## 2024-01-17 MED ORDER — GLIMEPIRIDE 1 MG PO TABS
1.0000 mg | ORAL_TABLET | Freq: Every day | ORAL | 0 refills | Status: DC
Start: 2024-01-17 — End: 2024-04-19

## 2024-01-17 NOTE — Telephone Encounter (Signed)
 Glimepiride prescribed. Continue Metformin . Patient may also consider calling his health insurance to see which diabetic medications are covered under his plan.

## 2024-03-08 ENCOUNTER — Other Ambulatory Visit: Payer: Self-pay | Admitting: Family

## 2024-03-08 DIAGNOSIS — E785 Hyperlipidemia, unspecified: Secondary | ICD-10-CM

## 2024-03-19 ENCOUNTER — Other Ambulatory Visit: Payer: Self-pay | Admitting: Family

## 2024-03-19 DIAGNOSIS — E785 Hyperlipidemia, unspecified: Secondary | ICD-10-CM

## 2024-03-20 ENCOUNTER — Telehealth: Payer: Self-pay | Admitting: Emergency Medicine

## 2024-03-20 ENCOUNTER — Other Ambulatory Visit: Payer: Self-pay | Admitting: Family

## 2024-03-20 DIAGNOSIS — E785 Hyperlipidemia, unspecified: Secondary | ICD-10-CM

## 2024-03-20 MED ORDER — ATORVASTATIN CALCIUM 40 MG PO TABS: 60.0000 mg | ORAL_TABLET | Freq: Every day | ORAL | 2 refills | Status: DC

## 2024-03-20 NOTE — Telephone Encounter (Signed)
 Complete

## 2024-03-20 NOTE — Telephone Encounter (Signed)
 Copied from CRM [id number]. Topic: Clinical - Prescription Issue >> Mar 20, 2024  9:19 AM Willma SAUNDERS wrote: Reason for CRM: Patient requested a refill of atorvastatin  (LIPITOR) 40 MG tablet. Refill was declined stating he needs to be seen. Has a scheduled appointment for 04/19/24 but was unable to schedule anything sooner. Patient would like guidance on what he should do as he needs his medicine sooner than August.   Patient can be reached at [phone]

## 2024-04-11 ENCOUNTER — Other Ambulatory Visit: Payer: Self-pay | Admitting: Family

## 2024-04-11 DIAGNOSIS — E1165 Type 2 diabetes mellitus with hyperglycemia: Secondary | ICD-10-CM

## 2024-04-19 ENCOUNTER — Ambulatory Visit: Payer: Self-pay | Admitting: Family

## 2024-04-19 ENCOUNTER — Ambulatory Visit (INDEPENDENT_AMBULATORY_CARE_PROVIDER_SITE_OTHER): Admitting: Family

## 2024-04-19 ENCOUNTER — Encounter: Payer: Self-pay | Admitting: Family

## 2024-04-19 VITALS — BP 125/81 | HR 79 | Temp 97.9°F | Resp 16 | Ht 66.0 in | Wt 159.8 lb

## 2024-04-19 DIAGNOSIS — E119 Type 2 diabetes mellitus without complications: Secondary | ICD-10-CM

## 2024-04-19 DIAGNOSIS — Z7985 Long-term (current) use of injectable non-insulin antidiabetic drugs: Secondary | ICD-10-CM

## 2024-04-19 DIAGNOSIS — Z7984 Long term (current) use of oral hypoglycemic drugs: Secondary | ICD-10-CM | POA: Diagnosis not present

## 2024-04-19 DIAGNOSIS — I10 Essential (primary) hypertension: Secondary | ICD-10-CM

## 2024-04-19 DIAGNOSIS — E1165 Type 2 diabetes mellitus with hyperglycemia: Secondary | ICD-10-CM

## 2024-04-19 DIAGNOSIS — E785 Hyperlipidemia, unspecified: Secondary | ICD-10-CM

## 2024-04-19 LAB — POCT GLYCOSYLATED HEMOGLOBIN (HGB A1C): Hemoglobin A1C: 7 % — AB (ref 4.0–5.6)

## 2024-04-19 MED ORDER — GLIMEPIRIDE 1 MG PO TABS: 1.0000 mg | ORAL_TABLET | Freq: Every day | ORAL | 0 refills | Status: DC

## 2024-04-19 MED ORDER — ATORVASTATIN CALCIUM 40 MG PO TABS: 60.0000 mg | ORAL_TABLET | Freq: Every day | ORAL | 2 refills | Status: DC

## 2024-04-19 MED ORDER — LISINOPRIL-HYDROCHLOROTHIAZIDE 20-25 MG PO TABS: 1.0000 | ORAL_TABLET | Freq: Every day | ORAL | 2 refills | Status: DC

## 2024-04-19 MED ORDER — SITAGLIPTIN 25 MG PO TABS
25.0000 mg | ORAL_TABLET | Freq: Every day | ORAL | 0 refills | Status: AC
Start: 2024-04-19 — End: ?

## 2024-04-19 MED ORDER — EMPAGLIFLOZIN 10 MG PO TABS: 10.0000 mg | ORAL_TABLET | Freq: Every day | ORAL | 0 refills | Status: DC

## 2024-04-19 MED ORDER — METFORMIN HCL 1000 MG PO TABS: 1000.0000 mg | ORAL_TABLET | Freq: Two times a day (BID) | ORAL | 0 refills | Status: DC

## 2024-04-19 NOTE — Progress Notes (Signed)
 Patient ID: Jose Kelley, male    DOB: 1966-04-20  MRN: [id number]  CC: Chronic Conditions Follow-Up  Subjective: Jose Kelley is a 58 y.o. male who presents for chronic conditions follow-up.   His concerns today include:  - Doing well on Lisinopril -Hydrochlorothiazide , no issues/concerns. He does not complain of red flag symptoms such as but not limited to chest pain, shortness of breath, worst headache of life, nausea/vomiting.  - Doing well on Metformin , Glimepiride , Jardiance , and Sitagliptin , no issues/concerns. States he has to pay $30 out of pocket for Sitagliptin  and he is ok with this presently. Denies red flag symptoms associated with diabetes.  - Up to date on diabetic eye exam.  - Up to date on diabetic foot exam. - Doing well on Atorvastatin , no issues/concerns. - Patient plans to return at later date for annual physical exam.  Patient Active Problem List   Diagnosis Date Noted   Type 2 diabetes mellitus with hyperglycemia, without long-term current use of insulin (HCC) 01/14/2017   Hyperlipidemia 01/14/2017   Tobacco use disorder 01/14/2017   Essential hypertension 01/14/2017     Current Outpatient Medications on File Prior to Visit  Medication Sig Dispense Refill   blood glucose meter kit and supplies KIT Dispense based on patient and insurance preference. Use up to four times daily as directed. E11.65 1 each 0   glucose blood test strip Use as instructed 100 each 12   brompheniramine-pseudoephedrine-DM 30-2-10 MG/5ML syrup Take 5 mLs by mouth 4 (four) times daily as needed. 120 mL 0   guaiFENesin  200 MG tablet Take 1 tablet (200 mg total) by mouth every 8 (eight) hours as needed for cough or to loosen phlegm. 30 tablet 1   sitaGLIPtin  (JANUVIA ) 25 MG tablet Take 1 tablet (25 mg total) by mouth daily. 90 tablet 0   No current facility-administered medications on file prior to visit.    No Known Allergies  Social History   Socioeconomic History   Marital  status: Married    Spouse name: Not on file   Number of children: Not on file   Years of education: Not on file   Highest education level: 12th grade  Occupational History   Not on file  Tobacco Use   Smoking status: Every Day    Current packs/day: 0.00    Average packs/day: 0.3 packs/day for 10.0 years (2.5 ttl pk-yrs)    Types: Cigarettes    Start date: 07/29/2010    Last attempt to quit: 07/29/2020    Years since quitting: 3.7    Passive exposure: Current   Smokeless tobacco: Never  Vaping Use   Vaping status: Never Used  Substance and Sexual Activity   Alcohol use: Yes    Comment: Occasional beer   Drug use: No   Sexual activity: Not on file  Other Topics Concern   Not on file  Social History Narrative   Not on file   Social Drivers of Health   Financial Resource Strain: Patient Declined (04/15/2024)   Overall Financial Resource Strain (CARDIA)    Difficulty of Paying Living Expenses: Patient declined  Food Insecurity: Unknown (04/15/2024)   Hunger Vital Sign    Worried About Running Out of Food in the Last Year: Never true    Ran Out of Food in the Last Year: Not on file  Transportation Needs: No Transportation Needs (04/15/2024)   PRAPARE - Administrator, Civil Service (Medical): No    Lack of Transportation (Non-Medical): No  Physical Activity: Inactive (01/12/2024)   Exercise Vital Sign    Days of Exercise per Week: 0 days    Minutes of Exercise per Session: 0 min  Stress: No Stress Concern Present (12/04/2023)   [employer] of Occupational Health - Occupational Stress Questionnaire    Feeling of Stress : Not at all  Social Connections: Unknown (04/15/2024)   Social Connection and Isolation Panel    Frequency of Communication with Friends and Family: Patient declined    Frequency of Social Gatherings with Friends and Family: Patient declined    Attends Religious Services: Patient declined    Database administrator or Organizations: Yes     Attends Banker Meetings: Patient declined    Marital Status: Married  Catering manager Violence: Not At Risk (01/12/2024)   Humiliation, Afraid, Rape, and Kick questionnaire    Fear of Current or Ex-Partner: No    Emotionally Abused: No    Physically Abused: No    Sexually Abused: No    Family History  Problem Relation Age of Onset   Diabetes Mother    Stroke Mother    Cancer Brother    Pancreatic cancer Brother    Breast cancer Sister    Colon cancer Neg Hx    Esophageal cancer Neg Hx    Rectal cancer Neg Hx    Stomach cancer Neg Hx    Colon polyps Neg Hx     Past Surgical History:  Procedure Laterality Date   COLONOSCOPY     POLYPECTOMY     SHOULDER SURGERY Right     ROS: Review of Systems Negative except as stated above  PHYSICAL EXAM: BP 125/81   Pulse 79   Temp 97.9 F (36.6 C) (Oral)   Resp 16   Ht 5' 6 (1.676 m)   Wt 159 lb 12.8 oz (72.5 kg)   SpO2 94%   BMI 25.79 kg/m   Physical Exam HENT:     Head: Normocephalic and atraumatic.     Nose: Nose normal.     Mouth/Throat:     Mouth: Mucous membranes are moist.     Pharynx: Oropharynx is clear.  Eyes:     Extraocular Movements: Extraocular movements intact.     Conjunctiva/sclera: Conjunctivae normal.     Pupils: Pupils are equal, round, and reactive to light.  Cardiovascular:     Rate and Rhythm: Normal rate and regular rhythm.     Pulses: Normal pulses.     Heart sounds: Normal heart sounds.  Pulmonary:     Effort: Pulmonary effort is normal.     Breath sounds: Normal breath sounds.  Musculoskeletal:        General: Normal range of motion.     Cervical back: Normal range of motion and neck supple.  Neurological:     General: No focal deficit present.     Mental Status: He is alert and oriented to person, place, and time.  Psychiatric:        Mood and Affect: Mood normal.        Behavior: Behavior normal.     ASSESSMENT AND PLAN: 1. Primary hypertension (Primary) -  Continue Lisinopril -Hydrochlorothiazide  as prescribed.  - Counseled on blood pressure goal of less than 130/80, low-sodium, DASH diet, medication compliance, and 150 minutes of moderate intensity exercise per week as tolerated. Counseled on medication adherence and adverse effects. - Follow-up with primary provider in 3 months or sooner if needed.  - lisinopril -hydrochlorothiazide  (ZESTORETIC ) 20-25 MG tablet;  Take 1 tablet by mouth daily.  Dispense: 30 tablet; Refill: 2  2. Type 2 diabetes mellitus with hyperglycemia, without long-term current use of insulin (HCC) - Continue Metformin , Empagliflozin , Glimepiride , and Sitagliptin  as prescribed.  - Hemoglobin A1c result pending.  - Discussed the importance of healthy eating habits, low-carbohydrate diet, low-sugar diet, regular aerobic exercise (at least 150 minutes a week as tolerated) and medication compliance to achieve or maintain control of diabetes. Counseled on medication adherence/adverse effects.  - Follow-up with primary provider as scheduled. - glimepiride  (AMARYL ) 1 MG tablet; Take 1 tablet (1 mg total) by mouth daily with breakfast.  Dispense: 90 tablet; Refill: 0 - empagliflozin  (JARDIANCE ) 10 MG TABS tablet; Take 1 tablet (10 mg total) by mouth daily before breakfast.  Dispense: 90 tablet; Refill: 0 - metFORMIN  (GLUCOPHAGE ) 1000 MG tablet; Take 1 tablet (1,000 mg total) by mouth 2 (two) times daily with a meal.  Dispense: 180 tablet; Refill: 0 - SITagliptin  25 MG TABS; Take 25 mg by mouth daily.  Dispense: 90 tablet; Refill: 0 - POCT glycosylated hemoglobin (Hb A1C); Future  3. Diabetic eye exam (HCC) - Patient declined.  4. Encounter for diabetic foot exam (HCC) - Patient declined.   5. Hyperlipidemia, unspecified hyperlipidemia type - Continue Atorvastatin  as prescribed. Counseled on medication adherence/adverse effects.  - Follow-up with primary provider in 3 months or sooner if needed. - atorvastatin  (LIPITOR) 40 MG tablet;  Take 1.5 tablets (60 mg total) by mouth daily.  Dispense: 45 tablet; Refill: 2   Patient was given the opportunity to ask questions.  Patient verbalized understanding of the plan and was able to repeat key elements of the plan. Patient was given clear instructions to go to Emergency Department or return to medical center if symptoms don't improve, worsen, or new problems develop.The patient verbalized understanding.   Orders Placed This Encounter  Procedures   POCT glycosylated hemoglobin (Hb A1C)     Requested Prescriptions   Signed Prescriptions Disp Refills   atorvastatin  (LIPITOR) 40 MG tablet 45 tablet 2    Sig: Take 1.5 tablets (60 mg total) by mouth daily.   glimepiride  (AMARYL ) 1 MG tablet 90 tablet 0    Sig: Take 1 tablet (1 mg total) by mouth daily with breakfast.   empagliflozin  (JARDIANCE ) 10 MG TABS tablet 90 tablet 0    Sig: Take 1 tablet (10 mg total) by mouth daily before breakfast.   lisinopril -hydrochlorothiazide  (ZESTORETIC ) 20-25 MG tablet 30 tablet 2    Sig: Take 1 tablet by mouth daily.   metFORMIN  (GLUCOPHAGE ) 1000 MG tablet 180 tablet 0    Sig: Take 1 tablet (1,000 mg total) by mouth 2 (two) times daily with a meal.   SITagliptin  25 MG TABS 90 tablet 0    Sig: Take 25 mg by mouth daily.    Follow-up with primary provider as scheduled.  Greig JINNY Drones, NP

## 2024-05-29 LAB — HM DIABETES EYE EXAM

## 2024-05-31 ENCOUNTER — Ambulatory Visit: Payer: Self-pay | Admitting: Family

## 2024-07-09 ENCOUNTER — Other Ambulatory Visit: Payer: Self-pay | Admitting: Family

## 2024-07-09 DIAGNOSIS — I10 Essential (primary) hypertension: Secondary | ICD-10-CM

## 2024-07-09 NOTE — Telephone Encounter (Signed)
 Complete

## 2024-08-09 ENCOUNTER — Ambulatory Visit: Admitting: Family

## 2024-08-09 ENCOUNTER — Encounter: Payer: Self-pay | Admitting: Family

## 2024-08-09 VITALS — BP 115/72 | HR 74 | Temp 98.5°F | Resp 16 | Ht 66.0 in | Wt 162.0 lb

## 2024-08-09 DIAGNOSIS — I10 Essential (primary) hypertension: Secondary | ICD-10-CM

## 2024-08-09 DIAGNOSIS — E785 Hyperlipidemia, unspecified: Secondary | ICD-10-CM | POA: Diagnosis not present

## 2024-08-09 DIAGNOSIS — Z7984 Long term (current) use of oral hypoglycemic drugs: Secondary | ICD-10-CM | POA: Diagnosis not present

## 2024-08-09 DIAGNOSIS — E1165 Type 2 diabetes mellitus with hyperglycemia: Secondary | ICD-10-CM | POA: Diagnosis not present

## 2024-08-09 DIAGNOSIS — E119 Type 2 diabetes mellitus without complications: Secondary | ICD-10-CM

## 2024-08-09 MED ORDER — EMPAGLIFLOZIN 10 MG PO TABS: 10.0000 mg | ORAL_TABLET | Freq: Every day | ORAL | 0 refills | Status: AC

## 2024-08-09 MED ORDER — METFORMIN HCL 1000 MG PO TABS: 1000.0000 mg | ORAL_TABLET | Freq: Two times a day (BID) | ORAL | 0 refills | Status: AC

## 2024-08-09 MED ORDER — GLIMEPIRIDE 1 MG PO TABS: 1.0000 mg | ORAL_TABLET | Freq: Every day | ORAL | 0 refills | Status: AC

## 2024-08-09 MED ORDER — ATORVASTATIN CALCIUM 40 MG PO TABS: 60.0000 mg | ORAL_TABLET | Freq: Every day | ORAL | 2 refills | Status: AC

## 2024-08-09 MED ORDER — LISINOPRIL-HYDROCHLOROTHIAZIDE 20-25 MG PO TABS: 1.0000 | ORAL_TABLET | Freq: Every day | ORAL | 0 refills | Status: AC

## 2024-08-09 MED ORDER — SITAGLIPTIN PHOSPHATE 25 MG PO TABS
25.0000 mg | ORAL_TABLET | Freq: Every day | ORAL | 0 refills | Status: AC
Start: 2024-08-09 — End: 2024-11-07

## 2024-08-09 NOTE — Progress Notes (Signed)
Follow up, medication refill

## 2024-08-09 NOTE — Progress Notes (Signed)
 Patient ID: Jose Kelley, male    DOB: September 11, 1966  MRN: [id number]  CC: Chronic Conditions Follow-Up  Subjective: Jose Kelley is a 59 y.o. male who presents for chronic conditions follow-up.  His concerns today include:  - Doing well on Lisinopril -Hydrochlorothiazide , no issues/concerns. He does not complain of red flag symptoms such as but not limited to chest pain, shortness of breath, worst headache of life, nausea/vomiting.  - Doing well on Metformin , Empagliflozin , Glimepiride , and Sitagliptin , no issues/concerns. Denies red flag symptoms associated with diabetes.  - States up to date on diabetic eye exam.  - Due for diabetic foot exam. - Doing well on Atorvastatin , no issues/concerns.   Patient Active Problem List   Diagnosis Date Noted   Type 2 diabetes mellitus with hyperglycemia, without long-term current use of insulin (HCC) 01/14/2017   Hyperlipidemia 01/14/2017   Tobacco use disorder 01/14/2017   Essential hypertension 01/14/2017     Current Outpatient Medications on File Prior to Visit  Medication Sig Dispense Refill   blood glucose meter kit and supplies KIT Dispense based on patient and insurance preference. Use up to four times daily as directed. E11.65 1 each 0   glucose blood test strip Use as instructed 100 each 12   SITagliptin  25 MG TABS Take 25 mg by mouth daily. 90 tablet 0   brompheniramine-pseudoephedrine-DM 30-2-10 MG/5ML syrup Take 5 mLs by mouth 4 (four) times daily as needed. 120 mL 0   guaiFENesin  200 MG tablet Take 1 tablet (200 mg total) by mouth every 8 (eight) hours as needed for cough or to loosen phlegm. 30 tablet 1   No current facility-administered medications on file prior to visit.    No Known Allergies  Social History   Socioeconomic History   Marital status: Married    Spouse name: Not on file   Number of children: Not on file   Years of education: Not on file   Highest education level: 12th grade  Occupational History   Not  on file  Tobacco Use   Smoking status: Every Day    Current packs/day: 0.00    Average packs/day: 0.3 packs/day for 10.0 years (2.5 ttl pk-yrs)    Types: Cigarettes    Start date: 07/29/2010    Last attempt to quit: 07/29/2020    Years since quitting: 4.0    Passive exposure: Current   Smokeless tobacco: Never  Vaping Use   Vaping status: Never Used  Substance and Sexual Activity   Alcohol use: Yes    Comment: Occasional beer   Drug use: No   Sexual activity: Not Currently  Other Topics Concern   Not on file  Social History Narrative   Not on file   Social Drivers of Health   Financial Resource Strain: Patient Declined (04/15/2024)   Overall Financial Resource Strain (CARDIA)    Difficulty of Paying Living Expenses: Patient declined  Food Insecurity: No Food Insecurity (08/09/2024)   Hunger Vital Sign    Worried About Running Out of Food in the Last Year: Never true    Ran Out of Food in the Last Year: Never true  Transportation Needs: No Transportation Needs (04/15/2024)   PRAPARE - Administrator, Civil Service (Medical): No    Lack of Transportation (Non-Medical): No  Physical Activity: Inactive (01/12/2024)   Exercise Vital Sign    Days of Exercise per Week: 0 days    Minutes of Exercise per Session: 0 min  Stress: No Stress Concern  Present (12/04/2023)   [employer] of Occupational Health - Occupational Stress Questionnaire    Feeling of Stress : Not at all  Social Connections: Unknown (04/15/2024)   Social Connection and Isolation Panel    Frequency of Communication with Friends and Family: Patient declined    Frequency of Social Gatherings with Friends and Family: Patient declined    Attends Religious Services: Patient declined    Database Administrator or Organizations: Yes    Attends Banker Meetings: Patient declined    Marital Status: Married  Catering Manager Violence: Not At Risk (01/12/2024)   Humiliation, Afraid, Rape, and  Kick questionnaire    Fear of Current or Ex-Partner: No    Emotionally Abused: No    Physically Abused: No    Sexually Abused: No    Family History  Problem Relation Age of Onset   Diabetes Mother    Stroke Mother    Cancer Brother    Pancreatic cancer Brother    Breast cancer Sister    Colon cancer Neg Hx    Esophageal cancer Neg Hx    Rectal cancer Neg Hx    Stomach cancer Neg Hx    Colon polyps Neg Hx     Past Surgical History:  Procedure Laterality Date   COLONOSCOPY     POLYPECTOMY     SHOULDER SURGERY Right     ROS: Review of Systems Negative except as stated above  PHYSICAL EXAM: BP 115/72   Pulse 74   Temp 98.5 F (36.9 C) (Oral)   Resp 16   Ht 5' 6 (1.676 m)   Wt 162 lb (73.5 kg)   SpO2 94%   BMI 26.15 kg/m   Physical Exam HENT:     Head: Normocephalic and atraumatic.     Nose: Nose normal.     Mouth/Throat:     Mouth: Mucous membranes are moist.     Pharynx: Oropharynx is clear.  Eyes:     Extraocular Movements: Extraocular movements intact.     Conjunctiva/sclera: Conjunctivae normal.     Pupils: Pupils are equal, round, and reactive to light.  Cardiovascular:     Rate and Rhythm: Normal rate and regular rhythm.     Pulses: Normal pulses.     Heart sounds: Normal heart sounds.  Pulmonary:     Effort: Pulmonary effort is normal.     Breath sounds: Normal breath sounds.  Musculoskeletal:        General: Normal range of motion.     Cervical back: Normal range of motion and neck supple.  Neurological:     General: No focal deficit present.     Mental Status: He is alert and oriented to person, place, and time.  Psychiatric:        Mood and Affect: Mood normal.        Behavior: Behavior normal.     ASSESSMENT AND PLAN: 1. Primary hypertension (Primary) - Continue Lisinopril -Hydrochlorothiazide  as prescribed. - Routine screening.  - Counseled on blood pressure goal of less than 130/80, low-sodium, DASH diet, medication compliance,  and 150 minutes of moderate intensity exercise per week as tolerated. Counseled on medication adherence and adverse effects. - Follow-up with primary provider in 3 months or sooner if needed.  - lisinopril -hydrochlorothiazide  (ZESTORETIC ) 20-25 MG tablet; Take 1 tablet by mouth daily.  Dispense: 90 tablet; Refill: 0 - Basic Metabolic Panel  2. Type 2 diabetes mellitus with hyperglycemia, without long-term current use of insulin (HCC) - Hemoglobin  A1c result pending.  - Continue Metformin , Empagliflozin , Glimepiride , and Sitagliptin  as prescribed. - Routine screening.  - Discussed the importance of healthy eating habits, low-carbohydrate diet, low-sugar diet, regular aerobic exercise (at least 150 minutes a week as tolerated) and medication compliance to achieve or maintain control of diabetes. Counseled on medication adherence/adverse effects.  - Follow-up with primary provider as scheduled.  - metFORMIN  (GLUCOPHAGE ) 1000 MG tablet; Take 1 tablet (1,000 mg total) by mouth 2 (two) times daily with a meal.  Dispense: 180 tablet; Refill: 0 - empagliflozin  (JARDIANCE ) 10 MG TABS tablet; Take 1 tablet (10 mg total) by mouth daily before breakfast.  Dispense: 90 tablet; Refill: 0 - sitaGLIPtin  (JANUVIA ) 25 MG tablet; Take 1 tablet (25 mg total) by mouth daily.  Dispense: 90 tablet; Refill: 0 - Hemoglobin A1c - glimepiride  (AMARYL ) 1 MG tablet; Take 1 tablet (1 mg total) by mouth daily with breakfast.  Dispense: 90 tablet; Refill: 0 - Microalbumin / creatinine urine ratio  3. Diabetic eye exam [facility]) - Patient states up to date.   4. Encounter for diabetic foot exam (HCC) - Referral to Podiatry for evaluation/management. - Ambulatory referral to Podiatry  5. Hyperlipidemia, unspecified hyperlipidemia type - Continue Atorvastatin  as prescribed. Counseled on medication adherence/adverse effects.  - Routine screening.  - Follow-up with primary provider as scheduled.  - atorvastatin  (LIPITOR) 40 MG  tablet; Take 1.5 tablets (60 mg total) by mouth daily.  Dispense: 45 tablet; Refill: 2 - Lipid panel   Patient was given the opportunity to ask questions.  Patient verbalized understanding of the plan and was able to repeat key elements of the plan. Patient was given clear instructions to go to Emergency Department or return to medical center if symptoms don't improve, worsen, or new problems develop.The patient verbalized understanding.   Orders Placed This Encounter  Procedures   Basic Metabolic Panel   Lipid panel   Hemoglobin A1c   Microalbumin / creatinine urine ratio     Requested Prescriptions   Signed Prescriptions Disp Refills   lisinopril -hydrochlorothiazide  (ZESTORETIC ) 20-25 MG tablet 90 tablet 0    Sig: Take 1 tablet by mouth daily.   metFORMIN  (GLUCOPHAGE ) 1000 MG tablet 180 tablet 0    Sig: Take 1 tablet (1,000 mg total) by mouth 2 (two) times daily with a meal.   empagliflozin  (JARDIANCE ) 10 MG TABS tablet 90 tablet 0    Sig: Take 1 tablet (10 mg total) by mouth daily before breakfast.   sitaGLIPtin  (JANUVIA ) 25 MG tablet 90 tablet 0    Sig: Take 1 tablet (25 mg total) by mouth daily.   atorvastatin  (LIPITOR) 40 MG tablet 45 tablet 2    Sig: Take 1.5 tablets (60 mg total) by mouth daily.   glimepiride  (AMARYL ) 1 MG tablet 90 tablet 0    Sig: Take 1 tablet (1 mg total) by mouth daily with breakfast.    Return in about 3 months (around 11/09/2024) for Follow-Up or next available chronic conditions.  Greig JINNY Chute, NP

## 2024-08-10 LAB — BASIC METABOLIC PANEL WITH GFR
BUN/Creatinine Ratio: 10 (ref 9–20)
BUN: 11 mg/dL (ref 6–24)
CO2: 22 mmol/L (ref 20–29)
Calcium: 10.2 mg/dL (ref 8.7–10.2)
Chloride: 98 mmol/L (ref 96–106)
Creatinine, Ser: 1.06 mg/dL (ref 0.76–1.27)
Glucose: 75 mg/dL (ref 70–99)
Potassium: 4.1 mmol/L (ref 3.5–5.2)
Sodium: 137 mmol/L (ref 134–144)
eGFR: 81 mL/min/1.73 (ref >59)

## 2024-08-10 LAB — MICROALBUMIN / CREATININE URINE RATIO
Creatinine, Urine: 76.3 mg/dL
Microalb/Creat Ratio: 6 mg/g{creat} (ref 0–29)
Microalbumin, Urine: 4.8 ug/mL

## 2024-08-10 LAB — LIPID PANEL
Chol/HDL Ratio: 2.7 ratio (ref 0.0–5.0)
Cholesterol, Total: 106 mg/dL (ref 100–199)
HDL: 39 mg/dL — ABNORMAL LOW (ref >39)
LDL Chol Calc (NIH): 54 mg/dL (ref 0–99)
Triglycerides: 59 mg/dL (ref 0–149)
VLDL Cholesterol Cal: 13 mg/dL (ref 5–40)

## 2024-08-10 LAB — HEMOGLOBIN A1C
Est. average glucose Bld gHb Est-mCnc: 163 mg/dL
Hgb A1c MFr Bld: 7.3 % — ABNORMAL HIGH (ref 4.8–5.6)

## 2024-08-12 ENCOUNTER — Ambulatory Visit: Payer: Self-pay | Admitting: Family

## 2024-08-12 DIAGNOSIS — E1165 Type 2 diabetes mellitus with hyperglycemia: Secondary | ICD-10-CM

## 2024-08-12 MED ORDER — EMPAGLIFLOZIN 25 MG PO TABS
25.0000 mg | ORAL_TABLET | Freq: Every day | ORAL | 0 refills | Status: AC
Start: 2024-08-12 — End: 2024-11-10

## 2024-11-08 ENCOUNTER — Ambulatory Visit: Admitting: Family
